# Patient Record
Sex: Male | Born: 1975 | Race: White | Hispanic: No | Smoking: Light tobacco smoker
Health system: Southern US, Community
[De-identification: ages and names within clinical notes are randomized; demographics above are authoritative.]

## PROBLEM LIST (undated history)

## (undated) DIAGNOSIS — F4310 Post-traumatic stress disorder, unspecified: Secondary | ICD-10-CM

## (undated) DIAGNOSIS — F32A Depression, unspecified: Secondary | ICD-10-CM

## (undated) DIAGNOSIS — G4700 Insomnia, unspecified: Secondary | ICD-10-CM

## (undated) DIAGNOSIS — K589 Irritable bowel syndrome without diarrhea: Secondary | ICD-10-CM

## (undated) DIAGNOSIS — F419 Anxiety disorder, unspecified: Secondary | ICD-10-CM

## (undated) DIAGNOSIS — K219 Gastro-esophageal reflux disease without esophagitis: Secondary | ICD-10-CM

## (undated) DIAGNOSIS — F329 Major depressive disorder, single episode, unspecified: Secondary | ICD-10-CM

## (undated) DIAGNOSIS — F319 Bipolar disorder, unspecified: Secondary | ICD-10-CM

## (undated) DIAGNOSIS — F909 Attention-deficit hyperactivity disorder, unspecified type: Secondary | ICD-10-CM

## (undated) HISTORY — PX: UPPER GASTROINTESTINAL ENDOSCOPY: SHX188

---

## 1998-06-03 ENCOUNTER — Emergency Department (HOSPITAL_COMMUNITY): Admission: EM | Admit: 1998-06-03 | Discharge: 1998-06-03 | Payer: Self-pay | Admitting: Emergency Medicine

## 2004-12-06 ENCOUNTER — Ambulatory Visit: Payer: Self-pay | Admitting: Internal Medicine

## 2004-12-07 ENCOUNTER — Ambulatory Visit: Payer: Self-pay | Admitting: Internal Medicine

## 2004-12-09 ENCOUNTER — Ambulatory Visit (HOSPITAL_COMMUNITY): Admission: RE | Admit: 2004-12-09 | Discharge: 2004-12-09 | Payer: Self-pay | Admitting: Internal Medicine

## 2004-12-20 ENCOUNTER — Ambulatory Visit: Payer: Self-pay | Admitting: *Deleted

## 2004-12-23 ENCOUNTER — Ambulatory Visit: Payer: Self-pay | Admitting: Internal Medicine

## 2005-01-31 ENCOUNTER — Ambulatory Visit: Payer: Self-pay | Admitting: Internal Medicine

## 2005-04-15 ENCOUNTER — Ambulatory Visit: Payer: Self-pay | Admitting: Internal Medicine

## 2005-08-02 ENCOUNTER — Ambulatory Visit: Payer: Self-pay | Admitting: Internal Medicine

## 2005-08-18 ENCOUNTER — Encounter (INDEPENDENT_AMBULATORY_CARE_PROVIDER_SITE_OTHER): Payer: Self-pay | Admitting: *Deleted

## 2005-08-18 ENCOUNTER — Ambulatory Visit (HOSPITAL_COMMUNITY): Admission: RE | Admit: 2005-08-18 | Discharge: 2005-08-18 | Payer: Self-pay | Admitting: Internal Medicine

## 2005-08-23 ENCOUNTER — Ambulatory Visit: Payer: Self-pay | Admitting: Internal Medicine

## 2005-12-09 ENCOUNTER — Ambulatory Visit: Payer: Self-pay | Admitting: Internal Medicine

## 2006-05-18 ENCOUNTER — Ambulatory Visit: Payer: Self-pay | Admitting: Internal Medicine

## 2006-06-05 ENCOUNTER — Emergency Department (HOSPITAL_COMMUNITY): Admission: EM | Admit: 2006-06-05 | Discharge: 2006-06-05 | Payer: Self-pay | Admitting: Family Medicine

## 2006-09-07 ENCOUNTER — Ambulatory Visit: Payer: Self-pay | Admitting: Internal Medicine

## 2007-01-11 ENCOUNTER — Ambulatory Visit: Payer: Self-pay | Admitting: Internal Medicine

## 2007-01-16 ENCOUNTER — Ambulatory Visit: Payer: Self-pay | Admitting: Internal Medicine

## 2007-02-23 ENCOUNTER — Ambulatory Visit: Payer: Self-pay | Admitting: Internal Medicine

## 2007-05-02 ENCOUNTER — Encounter (INDEPENDENT_AMBULATORY_CARE_PROVIDER_SITE_OTHER): Payer: Self-pay | Admitting: *Deleted

## 2007-11-06 ENCOUNTER — Emergency Department (HOSPITAL_COMMUNITY): Admission: EM | Admit: 2007-11-06 | Discharge: 2007-11-06 | Payer: Self-pay | Admitting: Emergency Medicine

## 2010-08-18 ENCOUNTER — Emergency Department (HOSPITAL_COMMUNITY)
Admission: EM | Admit: 2010-08-18 | Discharge: 2010-08-18 | Payer: Self-pay | Source: Home / Self Care | Admitting: Emergency Medicine

## 2010-08-18 LAB — CBC
HCT: 38.2 % — ABNORMAL LOW (ref 39.0–52.0)
Hemoglobin: 13.4 g/dL (ref 13.0–17.0)
MCH: 29.8 pg (ref 26.0–34.0)
MCHC: 35.1 g/dL (ref 30.0–36.0)
MCV: 84.9 fL (ref 78.0–100.0)
Platelets: 195 10*3/uL (ref 150–400)
RBC: 4.5 MIL/uL (ref 4.22–5.81)
RDW: 12.9 % (ref 11.5–15.5)
WBC: 9.4 10*3/uL (ref 4.0–10.5)

## 2010-08-18 LAB — RAPID URINE DRUG SCREEN, HOSP PERFORMED
Amphetamines: NOT DETECTED
Barbiturates: NOT DETECTED
Benzodiazepines: POSITIVE — AB
Cocaine: NOT DETECTED
Opiates: POSITIVE — AB
Tetrahydrocannabinol: NOT DETECTED

## 2010-08-18 LAB — DIFFERENTIAL
Basophils Absolute: 0.1 10*3/uL (ref 0.0–0.1)
Basophils Relative: 1 % (ref 0–1)
Eosinophils Absolute: 0.4 10*3/uL (ref 0.0–0.7)
Eosinophils Relative: 4 % (ref 0–5)
Lymphocytes Relative: 26 % (ref 12–46)
Lymphs Abs: 2.5 10*3/uL (ref 0.7–4.0)
Monocytes Absolute: 0.4 10*3/uL (ref 0.1–1.0)
Monocytes Relative: 4 % (ref 3–12)
Neutro Abs: 6.1 10*3/uL (ref 1.7–7.7)
Neutrophils Relative %: 65 % (ref 43–77)

## 2010-08-18 LAB — BASIC METABOLIC PANEL
BUN: 12 mg/dL (ref 6–23)
CO2: 27 mEq/L (ref 19–32)
Calcium: 9.4 mg/dL (ref 8.4–10.5)
Chloride: 104 mEq/L (ref 96–112)
Creatinine, Ser: 0.97 mg/dL (ref 0.4–1.5)
GFR calc Af Amer: >60 mL/min (ref >60)
GFR calc non Af Amer: >60 mL/min (ref >60)
Glucose, Bld: 90 mg/dL (ref 70–99)
Potassium: 3.3 mEq/L — ABNORMAL LOW (ref 3.5–5.1)
Sodium: 139 mEq/L (ref 135–145)

## 2010-08-18 LAB — URINALYSIS, ROUTINE W REFLEX MICROSCOPIC
Bilirubin Urine: NEGATIVE
Hemoglobin, Urine: NEGATIVE
Ketones, ur: NEGATIVE mg/dL
Nitrite: NEGATIVE
Protein, ur: NEGATIVE mg/dL
Specific Gravity, Urine: 1.019 (ref 1.005–1.030)
Urine Glucose, Fasting: NEGATIVE mg/dL
Urobilinogen, UA: 0.2 mg/dL (ref 0.0–1.0)
pH: 7 (ref 5.0–8.0)

## 2011-02-24 ENCOUNTER — Encounter: Payer: Self-pay | Admitting: *Deleted

## 2011-02-24 ENCOUNTER — Emergency Department (HOSPITAL_COMMUNITY)
Admission: EM | Admit: 2011-02-24 | Discharge: 2011-02-25 | Disposition: A | Discharge status: Other Acute Inpatient Hospital | Payer: Medicaid Other | Source: Home / Self Care | Attending: Emergency Medicine | Admitting: Emergency Medicine

## 2011-02-24 DIAGNOSIS — F419 Anxiety disorder, unspecified: Secondary | ICD-10-CM | POA: Insufficient documentation

## 2011-02-24 DIAGNOSIS — K589 Irritable bowel syndrome without diarrhea: Secondary | ICD-10-CM | POA: Insufficient documentation

## 2011-02-24 DIAGNOSIS — F329 Major depressive disorder, single episode, unspecified: Secondary | ICD-10-CM | POA: Insufficient documentation

## 2011-02-24 DIAGNOSIS — F3289 Other specified depressive episodes: Secondary | ICD-10-CM | POA: Insufficient documentation

## 2011-02-24 DIAGNOSIS — K219 Gastro-esophageal reflux disease without esophagitis: Secondary | ICD-10-CM | POA: Insufficient documentation

## 2011-02-24 HISTORY — DX: Anxiety disorder, unspecified: F41.9

## 2011-02-24 HISTORY — DX: Gastro-esophageal reflux disease without esophagitis: K21.9

## 2011-02-24 HISTORY — DX: Depression, unspecified: F32.A

## 2011-02-24 HISTORY — DX: Major depressive disorder, single episode, unspecified: F32.9

## 2011-02-24 HISTORY — DX: Irritable bowel syndrome without diarrhea: K58.9

## 2011-02-24 LAB — CBC
HCT: 37.9 % — ABNORMAL LOW (ref 39.0–52.0)
Hemoglobin: 13.7 g/dL (ref 13.0–17.0)
MCH: 29.5 pg (ref 26.0–34.0)
MCHC: 36.1 g/dL — ABNORMAL HIGH (ref 30.0–36.0)
MCV: 81.7 fL (ref 78.0–100.0)
Platelets: 203 10*3/uL (ref 150–400)
RBC: 4.64 MIL/uL (ref 4.22–5.81)
RDW: 13.2 % (ref 11.5–15.5)
WBC: 7.4 10*3/uL (ref 4.0–10.5)

## 2011-02-24 LAB — DIFFERENTIAL
Basophils Absolute: 0.1 10*3/uL (ref 0.0–0.1)
Basophils Relative: 1 % (ref 0–1)
Eosinophils Absolute: 0.4 10*3/uL (ref 0.0–0.7)
Eosinophils Relative: 5 % (ref 0–5)
Lymphocytes Relative: 31 % (ref 12–46)
Lymphs Abs: 2.3 10*3/uL (ref 0.7–4.0)
Monocytes Absolute: 0.4 10*3/uL (ref 0.1–1.0)
Monocytes Relative: 6 % (ref 3–12)
Neutro Abs: 4.3 10*3/uL (ref 1.7–7.7)
Neutrophils Relative %: 57 % (ref 43–77)

## 2011-02-24 LAB — BASIC METABOLIC PANEL
BUN: 9 mg/dL (ref 6–23)
CO2: 27 mEq/L (ref 19–32)
Calcium: 9.3 mg/dL (ref 8.4–10.5)
Chloride: 104 mEq/L (ref 96–112)
Creatinine, Ser: 0.7 mg/dL (ref 0.50–1.35)
GFR calc Af Amer: >60 mL/min (ref >60)
GFR calc non Af Amer: >60 mL/min (ref >60)
Glucose, Bld: 89 mg/dL (ref 70–99)
Potassium: 3.5 mEq/L (ref 3.5–5.1)
Sodium: 139 mEq/L (ref 135–145)

## 2011-02-24 LAB — RAPID URINE DRUG SCREEN, HOSP PERFORMED
Amphetamines: NOT DETECTED
Barbiturates: NOT DETECTED
Benzodiazepines: POSITIVE — AB
Cocaine: NOT DETECTED
Opiates: NOT DETECTED
Tetrahydrocannabinol: NOT DETECTED

## 2011-02-24 LAB — ETHANOL: Alcohol, Ethyl (B): <11 mg/dL (ref 0–11)

## 2011-02-24 MED ORDER — LORAZEPAM 1 MG PO TABS
2.0000 mg | ORAL_TABLET | Freq: Once | ORAL | Status: AC
  Administered 2011-02-24: 2 mg via ORAL
  Filled 2011-02-24: qty 2

## 2011-02-24 NOTE — ED Notes (Signed)
Pt sleeping. 
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
## 2011-02-24 NOTE — ED Notes (Signed)
Police sitting with patient 

## 2011-02-24 NOTE — ED Provider Notes (Signed)
History     Chief Complaint  Patient presents with  . Medical Clearance   The history is provided by the patient. No language interpreter was used.  the pt presented to [facility] today of his own volition.  He says he's had suicidal thoughts but would never act on them.  He feels very depressed and lies around the house almost all day every day.  He used to find joy in playing with his children, hunting and fishing but does none of those things now.  He recently lost his job.  He had a recent argument with his mother in law.  He wanted to see a psychiatrist but was sent to the ED for  Medical evaluation.  He gets his current meds through his PCP and doesn't feel that they are working as well as they used to.  Past Medical History  Diagnosis Date  . Anxiety   . Depression   . Acid reflux   . Irritable bowel syndrome     Past Surgical History  Procedure Date  . Upper gastrointestinal endoscopy     History reviewed. No pertinent family history.  History  Substance Use Topics  . Smoking status: Never Smoker   . Smokeless tobacco: Current User  . Alcohol Use: No      Review of Systems  Constitutional: Positive for activity change.  Psychiatric/Behavioral: Positive for suicidal ideas, dysphoric mood and agitation. The patient is nervous/anxious.   All other systems reviewed and are negative.    Physical Exam  BP 121/77  Pulse 83  Temp(Src) 97.7 F (36.5 C) (Oral)  Resp 24  Ht 5\' 8"  (1.727 m)  Wt 175 lb (79.379 kg)  BMI 26.61 kg/m2  SpO2 100%  Physical Exam  Nursing note and vitals reviewed. Constitutional: He is oriented to person, place, and time. Vital signs are normal. He appears well-developed and well-nourished.  HENT:  Head: Normocephalic and atraumatic.  Right Ear: External ear normal.  Left Ear: External ear normal.  Nose: Nose normal.  Mouth/Throat: No oropharyngeal exudate.  Eyes: Conjunctivae and EOM are normal. Pupils are equal, round, and reactive to  light. Right eye exhibits no discharge. Left eye exhibits no discharge. No scleral icterus.  Neck: Normal range of motion. Neck supple. No JVD present. No tracheal deviation present. No thyromegaly present.  Cardiovascular: Normal rate, regular rhythm, normal heart sounds, intact distal pulses and normal pulses.  Exam reveals no gallop and no friction rub.   No murmur heard. Pulmonary/Chest: Effort normal and breath sounds normal. No stridor. No respiratory distress. He has no wheezes. He has no rales. He exhibits no tenderness.  Abdominal: Soft. Normal appearance and bowel sounds are normal. He exhibits no distension and no mass. There is no tenderness. There is no rebound and no guarding.  Musculoskeletal: Normal range of motion. He exhibits no edema and no tenderness.  Lymphadenopathy:    He has no cervical adenopathy.  Neurological: He is alert and oriented to person, place, and time. He has normal reflexes. No cranial nerve deficit. Coordination normal. GCS eye subscore is 4. GCS verbal subscore is 5. GCS motor subscore is 6.  Reflex Scores:      Tricep reflexes are 2+ on the right side and 2+ on the left side.      Bicep reflexes are 2+ on the right side and 2+ on the left side.      Brachioradialis reflexes are 2+ on the right side and 2+ on the left side.  Patellar reflexes are 2+ on the right side and 2+ on the left side.      Achilles reflexes are 2+ on the right side and 2+ on the left side. Skin: Skin is warm and dry. No rash noted. He is not diaphoretic.  Psychiatric: He has a normal mood and affect. His speech is normal and behavior is normal. Judgment normal. Thought content is not paranoid and not delusional. Cognition and memory are normal. Cognition and memory are not impaired. Inappropriate judgment: flat affect and depressed mood. He expresses suicidal ideation. He expresses no homicidal ideation. He expresses no suicidal plans and no homicidal plans.    ED Course    Procedures  MDM Pt cooperative and willing to go to  [facility].  Samson Frederic states there is a bed available and she will send after she can give report.  i have discussed the pt with dr. Deretha Emory who has assumed care.  PATIENT SEEN BY ME. ACCEPTED BY [facility] DR READLING TO [facility] FOR DEPRESSION. SEEN BY [facility] HERE AT [facility].     Worthy Rancher, PA 02/25/11 0004  Shelda Jakes, MD 02/25/11 [phone]

## 2011-02-25 ENCOUNTER — Inpatient Hospital Stay (HOSPITAL_COMMUNITY)
Admission: AD | Admit: 2011-02-25 | Discharge: 2011-02-28 | DRG: 885 | Disposition: A | Discharge status: Home / Self Care | Payer: Medicaid Other | Attending: Psychiatry | Admitting: Psychiatry

## 2011-02-25 DIAGNOSIS — F1021 Alcohol dependence, in remission: Secondary | ICD-10-CM

## 2011-02-25 DIAGNOSIS — K219 Gastro-esophageal reflux disease without esophagitis: Secondary | ICD-10-CM

## 2011-02-25 DIAGNOSIS — Z6379 Other stressful life events affecting family and household: Secondary | ICD-10-CM

## 2011-02-25 DIAGNOSIS — F339 Major depressive disorder, recurrent, unspecified: Secondary | ICD-10-CM

## 2011-02-25 DIAGNOSIS — K589 Irritable bowel syndrome without diarrhea: Secondary | ICD-10-CM

## 2011-02-25 DIAGNOSIS — F4010 Social phobia, unspecified: Secondary | ICD-10-CM

## 2011-02-25 DIAGNOSIS — F17200 Nicotine dependence, unspecified, uncomplicated: Secondary | ICD-10-CM

## 2011-02-25 DIAGNOSIS — F6381 Intermittent explosive disorder: Secondary | ICD-10-CM

## 2011-02-25 DIAGNOSIS — R45851 Suicidal ideations: Secondary | ICD-10-CM

## 2011-02-25 DIAGNOSIS — M25569 Pain in unspecified knee: Secondary | ICD-10-CM

## 2011-02-25 DIAGNOSIS — F332 Major depressive disorder, recurrent severe without psychotic features: Principal | ICD-10-CM

## 2011-02-25 DIAGNOSIS — Z818 Family history of other mental and behavioral disorders: Secondary | ICD-10-CM
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
## 2011-03-02 NOTE — Discharge Summary (Signed)
  NAMESIRIS, HOOS                 ACCOUNT NO.:  [id number]  MEDICAL RECORD NO.:  [id number]  LOCATION:  0504                          FACILITY:  [facility]  PHYSICIAN:  Franchot Gallo, MD     DATE OF BIRTH:  [birth year]  DATE OF ADMISSION:  02/25/2011 DATE OF DISCHARGE:  02/28/2011                              DISCHARGE SUMMARY   The patient was admitted for complaints of severe depressive symptoms with suicidal thoughts.  His sleep was decreased, his appetite was decreased and he was rating his depression an 8 on a scale of 1-10.  FINAL IMPRESSION:  Major depressive disorder, recurrent, severe, social phobia, intermittent explosive disorder, alcohol dependence in remission.  PERTINENT LABS:  His CMET was within normal limits.  His CBC was within normal limits.  Alcohol level was less than 11.  The patient was denying any suicidal thoughts.  His stress was being out of work and unable to find employment.  His appetite was improving.  He was having moderate depressive symptoms rating it a 4 on a scale of 1- 10.  Denied any suicidal or homicidal thoughts.  He was endorsing anxiety, rating it a 7-8 on a scale of 1-10, having no medication side effects.  We discontinued his trazodone and added Vistaril for sleep. We also had tramadol available for complaints of knee pain.  He was beginning to feel better, reporting mild depressive symptoms, having no medication side effects, on day of discharge felt he was ready to go, his sleep was good with Ambien, he was having a good appetite.  His depressive symptoms had resolved rating it a 0 on a scale of 1-10.  He adamantly denied any suicidal or homicidal thoughts or auditory or visualizations, reporting his anxiety a 3 on a scale of 1-10.    Discharge medications:  Cymbalta 60 mg one daily, hydroxyzine 50 mg taking 2 at bedtime, Ambien for sleep, Klonopin 1 mg t.i.d. for anxiety attacks, Nexium 40 mg for GERD symptoms, tramadol 50 mg taking 2  t.i.d. for knee pain.  The patient was to stop taking his Zoloft.  His follow- up appointment was with [facility] on Wednesday July 18 at 8:00 a.m., phone number [phone].     Landry Corporal, N.P.   ______________________________ Franchot Gallo, MD    JO/MEDQ  D:  03/01/2011  T:  03/02/2011  Job:  [id number]  Electronically Signed by Limmie PatriciaP. on 03/02/2011 09:47:45 AM Electronically Signed by Franchot Gallo MD on 03/02/2011 12:01:04 PM

## 2011-04-05 ENCOUNTER — Emergency Department (HOSPITAL_COMMUNITY)
Admission: EM | Admit: 2011-04-05 | Discharge: 2011-04-06 | Disposition: A | Discharge status: Other Acute Inpatient Hospital | Payer: Medicaid Other | Source: Home / Self Care | Attending: Emergency Medicine | Admitting: Emergency Medicine

## 2011-04-05 DIAGNOSIS — Z79899 Other long term (current) drug therapy: Secondary | ICD-10-CM | POA: Insufficient documentation

## 2011-04-05 DIAGNOSIS — F329 Major depressive disorder, single episode, unspecified: Secondary | ICD-10-CM | POA: Insufficient documentation

## 2011-04-05 DIAGNOSIS — F3289 Other specified depressive episodes: Secondary | ICD-10-CM | POA: Insufficient documentation

## 2011-04-05 LAB — COMPREHENSIVE METABOLIC PANEL
ALT: 20 U/L (ref 0–53)
AST: 16 U/L (ref 0–37)
Albumin: 4.6 g/dL (ref 3.5–5.2)
Alkaline Phosphatase: 60 U/L (ref 39–117)
BUN: 15 mg/dL (ref 6–23)
CO2: 31 mEq/L (ref 19–32)
Calcium: 9.9 mg/dL (ref 8.4–10.5)
Chloride: 97 mEq/L (ref 96–112)
Creatinine, Ser: 0.92 mg/dL (ref 0.50–1.35)
GFR calc Af Amer: >60 mL/min (ref >60)
GFR calc non Af Amer: >60 mL/min (ref >60)
Glucose, Bld: 85 mg/dL (ref 70–99)
Potassium: 3.4 mEq/L — ABNORMAL LOW (ref 3.5–5.1)
Sodium: 137 mEq/L (ref 135–145)
Total Bilirubin: 1 mg/dL (ref 0.3–1.2)
Total Protein: 8.5 g/dL — ABNORMAL HIGH (ref 6.0–8.3)

## 2011-04-05 LAB — DIFFERENTIAL
Basophils Absolute: 0.1 10*3/uL (ref 0.0–0.1)
Basophils Relative: 1 % (ref 0–1)
Eosinophils Absolute: 0.7 10*3/uL (ref 0.0–0.7)
Eosinophils Relative: 6 % — ABNORMAL HIGH (ref 0–5)
Lymphocytes Relative: 28 % (ref 12–46)
Lymphs Abs: 3.2 10*3/uL (ref 0.7–4.0)
Monocytes Absolute: 0.7 10*3/uL (ref 0.1–1.0)
Monocytes Relative: 6 % (ref 3–12)
Neutro Abs: 6.7 10*3/uL (ref 1.7–7.7)
Neutrophils Relative %: 59 % (ref 43–77)

## 2011-04-05 LAB — RAPID URINE DRUG SCREEN, HOSP PERFORMED
Amphetamines: NOT DETECTED
Barbiturates: NOT DETECTED
Benzodiazepines: POSITIVE — AB
Cocaine: NOT DETECTED
Opiates: NOT DETECTED
Tetrahydrocannabinol: NOT DETECTED

## 2011-04-05 LAB — CBC
HCT: 41.4 % (ref 39.0–52.0)
Hemoglobin: 14.8 g/dL (ref 13.0–17.0)
MCH: 29.5 pg (ref 26.0–34.0)
MCHC: 35.7 g/dL (ref 30.0–36.0)
MCV: 82.5 fL (ref 78.0–100.0)
Platelets: 259 10*3/uL (ref 150–400)
RBC: 5.02 MIL/uL (ref 4.22–5.81)
RDW: 13.9 % (ref 11.5–15.5)
WBC: 11.3 10*3/uL — ABNORMAL HIGH (ref 4.0–10.5)

## 2011-04-05 LAB — ETHANOL: Alcohol, Ethyl (B): <11 mg/dL (ref 0–11)

## 2011-04-06 ENCOUNTER — Inpatient Hospital Stay (HOSPITAL_COMMUNITY)
Admission: AD | Admit: 2011-04-06 | Discharge: 2011-04-11 | DRG: 885 | Disposition: A | Discharge status: Home / Self Care | Payer: Medicaid Other | Source: Ambulatory Visit | Attending: Psychiatry | Admitting: Psychiatry

## 2011-04-06 DIAGNOSIS — F6381 Intermittent explosive disorder: Secondary | ICD-10-CM

## 2011-04-06 DIAGNOSIS — R45851 Suicidal ideations: Secondary | ICD-10-CM

## 2011-04-06 DIAGNOSIS — M76899 Other specified enthesopathies of unspecified lower limb, excluding foot: Secondary | ICD-10-CM

## 2011-04-06 DIAGNOSIS — F4010 Social phobia, unspecified: Secondary | ICD-10-CM

## 2011-04-06 DIAGNOSIS — T1490XA Injury, unspecified, initial encounter: Secondary | ICD-10-CM

## 2011-04-06 DIAGNOSIS — G4700 Insomnia, unspecified: Secondary | ICD-10-CM

## 2011-04-06 DIAGNOSIS — Z6379 Other stressful life events affecting family and household: Secondary | ICD-10-CM

## 2011-04-06 DIAGNOSIS — K589 Irritable bowel syndrome without diarrhea: Secondary | ICD-10-CM

## 2011-04-06 DIAGNOSIS — F1021 Alcohol dependence, in remission: Secondary | ICD-10-CM

## 2011-04-06 DIAGNOSIS — K219 Gastro-esophageal reflux disease without esophagitis: Secondary | ICD-10-CM

## 2011-04-06 DIAGNOSIS — F17200 Nicotine dependence, unspecified, uncomplicated: Secondary | ICD-10-CM

## 2011-04-06 DIAGNOSIS — F339 Major depressive disorder, recurrent, unspecified: Secondary | ICD-10-CM

## 2011-04-06 DIAGNOSIS — M79609 Pain in unspecified limb: Secondary | ICD-10-CM

## 2011-04-06 DIAGNOSIS — Z79899 Other long term (current) drug therapy: Secondary | ICD-10-CM

## 2011-04-06 DIAGNOSIS — F332 Major depressive disorder, recurrent severe without psychotic features: Principal | ICD-10-CM

## 2011-04-06 DIAGNOSIS — Z818 Family history of other mental and behavioral disorders: Secondary | ICD-10-CM

## 2011-04-06 DIAGNOSIS — Z560 Unemployment, unspecified: Secondary | ICD-10-CM
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
## 2011-04-08 ENCOUNTER — Ambulatory Visit (HOSPITAL_COMMUNITY)
Admit: 2011-04-08 | Discharge: 2011-04-08 | Disposition: A | Discharge status: Home / Self Care | Payer: Medicaid Other | Attending: Psychiatry | Admitting: Psychiatry

## 2011-04-08 DIAGNOSIS — M79609 Pain in unspecified limb: Secondary | ICD-10-CM | POA: Insufficient documentation
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
## 2011-05-09 LAB — LIPASE, BLOOD: Lipase: 15

## 2011-05-09 LAB — URINALYSIS, ROUTINE W REFLEX MICROSCOPIC
Bilirubin Urine: NEGATIVE
Glucose, UA: NEGATIVE
Hgb urine dipstick: NEGATIVE
Ketones, ur: NEGATIVE
Nitrite: NEGATIVE
Protein, ur: NEGATIVE
Specific Gravity, Urine: 1.015
Urobilinogen, UA: 1
pH: 8

## 2011-05-09 LAB — COMPREHENSIVE METABOLIC PANEL
ALT: 12
AST: 17
Alkaline Phosphatase: 52
CO2: 27
Calcium: 9.5
GFR calc Af Amer: >60
GFR calc non Af Amer: >60
Glucose, Bld: 103 — ABNORMAL HIGH
Potassium: 3.8
Sodium: 139

## 2011-05-09 LAB — CBC
HCT: 36 — ABNORMAL LOW
Hemoglobin: 12.6 — ABNORMAL LOW
MCHC: 35
MCV: 86.6
Platelets: 229
RBC: 4.16 — ABNORMAL LOW
RDW: 13.2
WBC: 14 — ABNORMAL HIGH

## 2011-05-09 LAB — DIFFERENTIAL
Basophils Relative: 0
Eosinophils Absolute: 0.1
Eosinophils Relative: 1
Lymphs Abs: 1.4
Monocytes Relative: 4

## 2012-03-30 ENCOUNTER — Emergency Department (HOSPITAL_COMMUNITY): Payer: Medicaid Other

## 2012-03-30 ENCOUNTER — Emergency Department (HOSPITAL_COMMUNITY)
Admission: EM | Admit: 2012-03-30 | Discharge: 2012-03-30 | Disposition: A | Discharge status: Home / Self Care | Payer: Medicaid Other | Attending: Emergency Medicine | Admitting: Emergency Medicine

## 2012-03-30 ENCOUNTER — Encounter (HOSPITAL_COMMUNITY): Payer: Self-pay | Admitting: Emergency Medicine

## 2012-03-30 DIAGNOSIS — T07XXXA Unspecified multiple injuries, initial encounter: Secondary | ICD-10-CM

## 2012-03-30 DIAGNOSIS — W11XXXA Fall on and from ladder, initial encounter: Secondary | ICD-10-CM | POA: Insufficient documentation

## 2012-03-30 DIAGNOSIS — K589 Irritable bowel syndrome without diarrhea: Secondary | ICD-10-CM | POA: Insufficient documentation

## 2012-03-30 DIAGNOSIS — F341 Dysthymic disorder: Secondary | ICD-10-CM | POA: Insufficient documentation

## 2012-03-30 DIAGNOSIS — S39012A Strain of muscle, fascia and tendon of lower back, initial encounter: Secondary | ICD-10-CM

## 2012-03-30 DIAGNOSIS — K219 Gastro-esophageal reflux disease without esophagitis: Secondary | ICD-10-CM | POA: Insufficient documentation

## 2012-03-30 DIAGNOSIS — S335XXA Sprain of ligaments of lumbar spine, initial encounter: Secondary | ICD-10-CM | POA: Insufficient documentation

## 2012-03-30 DIAGNOSIS — Z79899 Other long term (current) drug therapy: Secondary | ICD-10-CM | POA: Insufficient documentation

## 2012-03-30 DIAGNOSIS — S139XXA Sprain of joints and ligaments of unspecified parts of neck, initial encounter: Secondary | ICD-10-CM | POA: Insufficient documentation

## 2012-03-30 DIAGNOSIS — S161XXA Strain of muscle, fascia and tendon at neck level, initial encounter: Secondary | ICD-10-CM

## 2012-03-30 HISTORY — DX: Post-traumatic stress disorder, unspecified: F43.10

## 2012-03-30 MED ORDER — METHOCARBAMOL 500 MG PO TABS
1000.0000 mg | ORAL_TABLET | Freq: Once | ORAL | Status: AC
  Administered 2012-03-30: 1000 mg via ORAL
  Filled 2012-03-30: qty 2

## 2012-03-30 MED ORDER — HYDROCODONE-ACETAMINOPHEN 7.5-325 MG PO TABS: ORAL_TABLET | ORAL | Status: DC

## 2012-03-30 MED ORDER — MORPHINE SULFATE 4 MG/ML IJ SOLN
8.0000 mg | Freq: Once | INTRAMUSCULAR | Status: AC
  Administered 2012-03-30: 4 mg via INTRAMUSCULAR
  Filled 2012-03-30: qty 1

## 2012-03-30 MED ORDER — ONDANSETRON HCL 4 MG PO TABS
4.0000 mg | ORAL_TABLET | Freq: Once | ORAL | Status: AC
  Administered 2012-03-30: 4 mg via ORAL
  Filled 2012-03-30: qty 1

## 2012-03-30 MED ORDER — METHOCARBAMOL 500 MG PO TABS: ORAL_TABLET | ORAL | Status: DC

## 2012-03-30 MED ORDER — MORPHINE SULFATE 4 MG/ML IJ SOLN
INTRAMUSCULAR | Status: AC
  Administered 2012-03-30: 4 mg
  Filled 2012-03-30: qty 1

## 2012-03-30 NOTE — ED Notes (Addendum)
Patient with no complaints at this time. Respirations even and unlabored. Skin warm/dry. Discharge instructions reviewed with patient at this time. Patient given opportunity to voice concerns/ask questions. Patient discharged at this time and left Emergency Department with steady gait.   

## 2012-03-30 NOTE — ED Provider Notes (Signed)
History     CSN: [id number]  Arrival date & time 03/30/12  1000   First MD Initiated Contact with Patient 03/30/12 1020      Chief Complaint  Patient presents with  . Fall    (Consider location/radiation/quality/duration/timing/severity/associated sxs/prior treatment) HPI Comments: the patient states that on Wednesday, August 14 he was approximately 10 feet on a ladder cleaning out a gutter. The patient states that he lost his footing and fell landing primarily on his back and buttocks. Since that time the patient states he has had problems with back pain, headaches, neck pain, left arm pain, and right knee pain. the patient has tried trauma gall at home as well as ibuprofen but states this is not helping. His significant other has been doing back rubs but he still has complaint of pain. There's been no trouble with cough or difficulty breathing, there's been no loss of consciousness, and there's been no hematuria. His been no fever reported.   Patient is a 36 y.o. male presenting with fall. The history is provided by the patient.  Fall Associated symptoms include abdominal pain. Pertinent negatives include no hematuria.    Past Medical History  Diagnosis Date  . Anxiety   . Depression   . Acid reflux   . Irritable bowel syndrome   . PTSD (post-traumatic stress disorder)     Past Surgical History  Procedure Date  . Upper gastrointestinal endoscopy     History reviewed. No pertinent family history.  History  Substance Use Topics  . Smoking status: Never Smoker   . Smokeless tobacco: Current User    Types: Snuff  . Alcohol Use: Yes      Review of Systems  Constitutional: Negative for activity change.       All ROS Neg except as noted in HPI  HENT: Negative for nosebleeds and neck pain.   Eyes: Negative for photophobia and discharge.  Respiratory: Negative for cough, shortness of breath and wheezing.   Cardiovascular: Negative for chest pain and palpitations.    Gastrointestinal: Positive for abdominal pain and diarrhea. Negative for blood in stool.  Genitourinary: Negative for dysuria, frequency and hematuria.  Musculoskeletal: Negative for back pain and arthralgias.  Skin: Negative.   Neurological: Negative for dizziness, seizures and speech difficulty.  Psychiatric/Behavioral: Negative for hallucinations and confusion. The patient is nervous/anxious.        Depression    Allergies  Divalproex sodium  Home Medications   Current Outpatient Rx  Name Route Sig Dispense Refill  . CLONAZEPAM 1 MG PO TABS Oral Take 1 mg by mouth 2 (two) times daily as needed.      Marland Kitchen ESOMEPRAZOLE MAGNESIUM 10 MG PO PACK Oral Take 10 mg by mouth daily before breakfast.      . ZOLOFT PO Oral Take by mouth.      . TESTOSTERONE ENANTHATE 200 MG/ML IM OIL Intramuscular Inject into the muscle every 28 (twenty-eight) days.      . TRAMADOL HCL ER 100 MG PO TB24 Oral Take 100 mg by mouth daily.      Marland Kitchen ZOLPIDEM TARTRATE ER 12.5 MG PO TBCR Oral Take 12.5 mg by mouth at bedtime as needed.        BP 118/91  Pulse 105  Temp 98 F (36.7 C) (Oral)  Resp 20  Ht 5\' 7"  (1.702 m)  Wt 180 lb (81.647 kg)  BMI 28.19 kg/m2  SpO2 98%  Physical Exam  Nursing note and vitals reviewed. Constitutional: He is  oriented to person, place, and time. He appears well-developed and well-nourished.  Non-toxic appearance.  HENT:  Head: Normocephalic.  Right Ear: Tympanic membrane and external ear normal.  Left Ear: Tympanic membrane and external ear normal.  Eyes: EOM and lids are normal. Pupils are equal, round, and reactive to light.  Neck: Normal range of motion. Neck supple. Carotid bruit is not present.       Pain to palpation at the base of the neck. No palpable step off. No carotid bruit.  Cardiovascular: Regular rhythm, normal heart sounds, intact distal pulses and normal pulses.  Tachycardia present.   Pulmonary/Chest: Breath sounds normal. No respiratory distress.        Symmetrical rise and fall of the chest. No crepitus. No palpable deformity of the right or left rib areas.  Abdominal: Soft. Bowel sounds are normal. There is no tenderness. There is no guarding.  Musculoskeletal: Normal range of motion.       Pain to palpation of the upper back the twin the shoulder blades to palpation. No bruising or deformity noted. Pain to palpation of the lumbar area with right paraspinal tenderness to palpation in change of position. Pain of the lower back with right straight leg raise.  Lymphadenopathy:       Head (right side): No submandibular adenopathy present.       Head (left side): No submandibular adenopathy present.    He has no cervical adenopathy.  Neurological: He is alert and oriented to person, place, and time. He has normal strength. No cranial nerve deficit or sensory deficit. He exhibits normal muscle tone. Coordination normal.       Grip symmetrical. Speech clear. No motor or sensory deficits.  Skin: Skin is warm and dry.  Psychiatric: His speech is normal. His mood appears anxious.    ED Course  Procedures (including critical care time)   Labs Reviewed  URINALYSIS, ROUTINE W REFLEX MICROSCOPIC   No results found.   No diagnosis found.    MDM  I have reviewed nursing notes, vital signs, and all appropriate lab and imaging results for this patient. The x-ray of the lumbar spine revealed some mild spondylosis, but no fracture or dislocation. The height and disc space were within normal limits. The cervical spine was well within normal limits on x-ray. The pelvis showed no fracture or dislocation. The patient was unable to give a urine specimen during his time here in the emergency department. Pain was moderately improved with IM morphine. I ambulated the patient in the room and  [facility] without problem.      Kathie Dike, [place] 03/30/12 1306

## 2012-03-30 NOTE — ED Notes (Signed)
Pt states he fell 22ft to the ground while cleaning out the gutter. Pt c/o back pain, headaches, left arm pain, and rt knee pain.

## 2012-03-30 NOTE — ED Provider Notes (Signed)
Medical screening examination/treatment/procedure(s) were performed by non-physician practitioner and as supervising physician I was immediately available for consultation/collaboration.   Laray Anger, DO 03/30/12 1851

## 2013-03-25 ENCOUNTER — Encounter (HOSPITAL_COMMUNITY): Payer: Self-pay | Admitting: Emergency Medicine

## 2013-03-25 ENCOUNTER — Emergency Department (HOSPITAL_COMMUNITY)
Admission: EM | Admit: 2013-03-25 | Discharge: 2013-03-26 | Disposition: A | Discharge status: Other Acute Inpatient Hospital | Payer: Medicaid Other | Attending: Dermatology | Admitting: Dermatology

## 2013-03-25 DIAGNOSIS — R450 Nervousness: Secondary | ICD-10-CM | POA: Insufficient documentation

## 2013-03-25 DIAGNOSIS — Z79899 Other long term (current) drug therapy: Secondary | ICD-10-CM | POA: Insufficient documentation

## 2013-03-25 DIAGNOSIS — Z8719 Personal history of other diseases of the digestive system: Secondary | ICD-10-CM | POA: Insufficient documentation

## 2013-03-25 DIAGNOSIS — F329 Major depressive disorder, single episode, unspecified: Secondary | ICD-10-CM | POA: Diagnosis present

## 2013-03-25 DIAGNOSIS — F419 Anxiety disorder, unspecified: Secondary | ICD-10-CM

## 2013-03-25 DIAGNOSIS — F4310 Post-traumatic stress disorder, unspecified: Secondary | ICD-10-CM | POA: Insufficient documentation

## 2013-03-25 DIAGNOSIS — F909 Attention-deficit hyperactivity disorder, unspecified type: Secondary | ICD-10-CM | POA: Diagnosis present

## 2013-03-25 DIAGNOSIS — R45851 Suicidal ideations: Secondary | ICD-10-CM | POA: Insufficient documentation

## 2013-03-25 DIAGNOSIS — F39 Unspecified mood [affective] disorder: Secondary | ICD-10-CM

## 2013-03-25 DIAGNOSIS — F411 Generalized anxiety disorder: Secondary | ICD-10-CM | POA: Insufficient documentation

## 2013-03-25 LAB — CBC
HCT: 36.6 % — ABNORMAL LOW (ref 39.0–52.0)
Hemoglobin: 13.5 g/dL (ref 13.0–17.0)
MCH: 31.2 pg (ref 26.0–34.0)
MCHC: 36.9 g/dL — ABNORMAL HIGH (ref 30.0–36.0)
RDW: 13.2 % (ref 11.5–15.5)

## 2013-03-25 LAB — COMPREHENSIVE METABOLIC PANEL
Albumin: 4.3 g/dL (ref 3.5–5.2)
BUN: 13 mg/dL (ref 6–23)
Calcium: 10.2 mg/dL (ref 8.4–10.5)
GFR calc Af Amer: >90 mL/min (ref >90)
Glucose, Bld: 103 mg/dL — ABNORMAL HIGH (ref 70–99)
Total Protein: 7.2 g/dL (ref 6.0–8.3)

## 2013-03-25 LAB — RAPID URINE DRUG SCREEN, HOSP PERFORMED
Cocaine: NOT DETECTED
Opiates: NOT DETECTED

## 2013-03-25 LAB — SALICYLATE LEVEL: Salicylate Lvl: <2 mg/dL — ABNORMAL LOW (ref 2.8–20.0)

## 2013-03-25 LAB — ETHANOL: Alcohol, Ethyl (B): <11 mg/dL (ref 0–11)

## 2013-03-25 MED ORDER — IBUPROFEN 200 MG PO TABS
600.0000 mg | ORAL_TABLET | Freq: Three times a day (TID) | ORAL | Status: DC | PRN
  Administered 2013-03-26: 600 mg via ORAL
  Filled 2013-03-25: qty 3

## 2013-03-25 MED ORDER — ZOLPIDEM TARTRATE 5 MG PO TABS
5.0000 mg | ORAL_TABLET | Freq: Every evening | ORAL | Status: DC | PRN
  Administered 2013-03-25: 5 mg via ORAL
  Filled 2013-03-25: qty 1

## 2013-03-25 MED ORDER — QUETIAPINE FUMARATE 50 MG PO TABS
50.0000 mg | ORAL_TABLET | Freq: Every day | ORAL | Status: DC
  Administered 2013-03-25: 50 mg via ORAL
  Filled 2013-03-25: qty 1

## 2013-03-25 MED ORDER — ACETAMINOPHEN 325 MG PO TABS: 650.0000 mg | ORAL_TABLET | ORAL | Status: DC | PRN

## 2013-03-25 MED ORDER — LORAZEPAM 1 MG PO TABS
1.0000 mg | ORAL_TABLET | Freq: Three times a day (TID) | ORAL | Status: DC | PRN
  Administered 2013-03-26: 1 mg via ORAL
  Filled 2013-03-25: qty 1

## 2013-03-25 NOTE — Consult Note (Signed)
[facility] Psychiatry Consult   Reason for Consult:  Evaluation for IP psychiatric mgmt Referring Physician:  [facility] EDP  Tyler Lambert is an 37 y.o. male.  Assessment: AXIS I:  Mood Disorder NOS and Post Traumatic Stress Disorder AXIS II:  No diagnosis AXIS III:   Past Medical History  Diagnosis Date  . Anxiety   . Depression   . Acid reflux   . Irritable bowel syndrome   . PTSD (post-traumatic stress disorder)    AXIS IV:  other psychosocial or environmental problems AXIS V:  11-20 some danger of hurting self or others possible OR occasionally fails to maintain minimal personal hygiene OR gross impairment in communication  Plan:  Recommend psychiatric Inpatient admission when medically cleared.  Subjective:   Tyler Lambert is a 37 y.o. male patient presenting voluntarily to [facility] ED due to worsening depressive sx over the past few months. Patient rates his depressive sx an 8/10 and endorses racing thoughts, decreased concentration, sadness, crying spells, mood swings, helplessness and hopelessness. The patient also endorses SI with plan to shoot himself in the woods. The patient does have access to firearms. The patient  Denies any HI or AVH. The patient has a hx of MDD and ADHD. Stressors include social economic stressor, and being taking of his long term adderall medication for treatment of his  ADHD.  HPI:  HPI Elements:     Past Psychiatric History: Past Medical History  Diagnosis Date  . Anxiety   . Depression   . Acid reflux   . Irritable bowel syndrome   . PTSD (post-traumatic stress disorder)     reports that he has never smoked. His smokeless tobacco use includes Snuff. He reports that  drinks alcohol. He reports that he does not use illicit drugs. History reviewed. No pertinent family history.         Allergies:   Allergies  Allergen Reactions  . Divalproex Sodium Other (See Comments)    Patient unsure of reaction    Past Psychiatric History: Diagnosis:  MDD and PTSD   Hospitalizations:  [facility] in the past  Outpatient Care: N/A  Substance Abuse Care:  n/a  Self-Mutilation:  no  Suicidal Attempts:  no  Violent Behaviors:  no   Objective: Blood pressure 113/74, pulse 100, temperature 98.4 F (36.9 C), temperature source Oral, resp. rate 17, SpO2 98.00%.There is no weight on file to calculate BMI. Results for orders placed during the hospital encounter of 03/25/13 (from the past 72 hour(s))  URINE RAPID DRUG SCREEN (HOSP PERFORMED)     Status: None   Collection Time    03/25/13  8:08 PM      Result Value Range   Opiates NONE DETECTED  NONE DETECTED   Cocaine NONE DETECTED  NONE DETECTED   Benzodiazepines NONE DETECTED  NONE DETECTED   Amphetamines NONE DETECTED  NONE DETECTED   Tetrahydrocannabinol NONE DETECTED  NONE DETECTED   Barbiturates NONE DETECTED  NONE DETECTED   Comment:            DRUG SCREEN FOR MEDICAL PURPOSES     ONLY.  IF CONFIRMATION IS NEEDED     FOR ANY PURPOSE, NOTIFY LAB     WITHIN 5 DAYS.                LOWEST DETECTABLE LIMITS     FOR URINE DRUG SCREEN     Drug Class       Cutoff (ng/mL)     Amphetamine  1000     Barbiturate      200     Benzodiazepine   200     Tricyclics       300     Opiates          300     Cocaine          300     THC              50  ACETAMINOPHEN LEVEL     Status: None   Collection Time    03/25/13  8:13 PM      Result Value Range   Acetaminophen (Tylenol), Serum 18.3  10 - 30 ug/mL   Comment:            THERAPEUTIC CONCENTRATIONS VARY     SIGNIFICANTLY. A RANGE OF 10-30     ug/mL MAY BE AN EFFECTIVE     CONCENTRATION FOR MANY PATIENTS.     HOWEVER, SOME ARE BEST TREATED     AT CONCENTRATIONS OUTSIDE THIS     RANGE.     ACETAMINOPHEN CONCENTRATIONS     >150 ug/mL AT 4 HOURS AFTER     INGESTION AND >50 ug/mL AT 12     HOURS AFTER INGESTION ARE     OFTEN ASSOCIATED WITH TOXIC     REACTIONS.  CBC     Status: Abnormal   Collection Time    03/25/13  8:13 PM      Result Value Range    WBC 9.3  4.0 - 10.5 K/uL   RBC 4.33  4.22 - 5.81 MIL/uL   Hemoglobin 13.5  13.0 - 17.0 g/dL   HCT 16.1 (*) 09.6 - 04.5 %   MCV 84.5  78.0 - 100.0 fL   MCH 31.2  26.0 - 34.0 pg   MCHC 36.9 (*) 30.0 - 36.0 g/dL   RDW 40.9  81.1 - 91.4 %   Platelets 258  150 - 400 K/uL  COMPREHENSIVE METABOLIC PANEL     Status: Abnormal   Collection Time    03/25/13  8:13 PM      Result Value Range   Sodium 136  135 - 145 mEq/L   Potassium 3.9  3.5 - 5.1 mEq/L   Chloride 100  96 - 112 mEq/L   CO2 24  19 - 32 mEq/L   Glucose, Bld 103 (*) 70 - 99 mg/dL   BUN 13  6 - 23 mg/dL   Creatinine, Ser 7.82  0.50 - 1.35 mg/dL   Calcium 95.6  8.4 - 21.3 mg/dL   Total Protein 7.2  6.0 - 8.3 g/dL   Albumin 4.3  3.5 - 5.2 g/dL   AST 16  0 - 37 U/L   ALT 17  0 - 53 U/L   Alkaline Phosphatase 47  39 - 117 U/L   Total Bilirubin 0.6  0.3 - 1.2 mg/dL   GFR calc non Af Amer >90  >90 mL/min   GFR calc Af Amer >90  >90 mL/min   Comment:            The eGFR has been calculated     using the CKD EPI equation.     This calculation has not been     validated in all clinical     situations.     eGFR's persistently     <90 mL/min signify     possible Chronic Kidney Disease.  Tyler Lambert     Status:  None   Collection Time    03/25/13  8:13 PM      Result Value Range   Alcohol, Ethyl (B) <11  0 - 11 mg/dL   Comment:            LOWEST DETECTABLE LIMIT FOR     SERUM ALCOHOL IS 11 mg/dL     FOR MEDICAL PURPOSES ONLY  SALICYLATE LEVEL     Status: Abnormal   Collection Time    03/25/13  8:13 PM      Result Value Range   Salicylate Lvl <2.0 (*) 2.8 - 20.0 mg/dL   Labs are reviewed and are pertinent for ( no critical lab values)  Current Facility-Administered Medications  Medication Dose Route Frequency Provider Last Rate Last Dose  . acetaminophen (TYLENOL) tablet 650 mg  650 mg Oral Q4H PRN Suzi Roots, MD      . ibuprofen (ADVIL,MOTRIN) tablet 600 mg  600 mg Oral Q8H PRN Suzi Roots, MD      . LORazepam  (ATIVAN) tablet 1 mg  1 mg Oral Q8H PRN Suzi Roots, MD      . QUEtiapine (SEROQUEL) tablet 50 mg  50 mg Oral QHS Kerry Hough, PA-C      . zolpidem (AMBIEN) tablet 5 mg  5 mg Oral QHS PRN Suzi Roots, MD       Current Outpatient Prescriptions  Medication Sig Dispense Refill  . clonazePAM (KLONOPIN) 1 MG tablet Take 1 mg by mouth 2 (two) times daily as needed for anxiety.       . diphenhydramine-acetaminophen (TYLENOL PM) 25-500 MG TABS Take 1 tablet by mouth at bedtime as needed (pain and sleep).       Marland Kitchen PARoxetine (PAXIL) 40 MG tablet Take 60 mg by mouth at bedtime. 40 mg and half of a 40 mg tablet      . traMADol (ULTRAM) 50 MG tablet Take 50 mg by mouth every 6 (six) hours as needed for pain.       Marland Kitchen zolpidem (AMBIEN) 10 MG tablet Take 10 mg by mouth at bedtime.        Psychiatric Specialty Exam:     Blood pressure 113/74, pulse 100, temperature 98.4 F (36.9 C), temperature source Oral, resp. rate 17, SpO2 98.00%.There is no weight on file to calculate BMI.  General Appearance: Casual  Eye Contact::  Good  Speech:  Normal Rate  Volume:  Decreased  Mood:  Depressed  Affect:  Congruent  Thought Process:  Circumstantial  Orientation:  Full (Time, Place, and Person)  Thought Content:  Negative  Suicidal Thoughts:  Yes.  with intent/plan  Homicidal Thoughts:  No  Memory:  Immediate;   Good  Judgement:  Fair  Insight:  Fair  Psychomotor Activity:  Negative  Concentration:  Fair  Recall:  Fair  Akathisia:  Negative  Handed:  Right  AIMS (if indicated):     Assets:  Desire for Improvement  Sleep:      Treatment Plan Summary: 1) Patient meets criteria for IP crises mgmt, safety and stabilization of MDD/ADHD with SI/Plan 2) Social work to aid in OP support services and psychiatric mgmt 3) Mgmt of co-morbid conditions 4) Administration of psychotropic and psychotherapeutic interventions  Tyler Lambert,Tyler Lambert 03/25/2013 11:44 PM  03/26/2013   Psychiatry note:  Mr Tribbett  remains suicidal.  He has been on Paxil and clonazepam for aver a month as well as therapy and is still suicidal daily.  No particular  reason, things are OK except for the problems the depression is causing, he says.  Says he still cannot contract for safety, so inpatient admission to [facility] is recommended if possible and if not to whatever facility can be found.

## 2013-03-25 NOTE — ED Provider Notes (Addendum)
CSN: [id number]     Arrival date & time 03/25/13  1928 History     First MD Initiated Contact with Patient 03/25/13 2002     Chief Complaint  Patient presents with  . Medical Clearance   (Consider location/radiation/quality/duration/timing/severity/associated sxs/prior Treatment) The history is provided by the patient.  pt with hx depression, anxiety, states feels more depressed in the past week. States on and off problems w depression x years. States unsure why feeling worse know, no specific exacerbating or alleviating factors. States intermittent thoughts of suicide, but no specific plan, no attempt. No thoughts of harm to others. Pt denies any recent change in meds. States compliant w normal meds. Family member states seemed to be functioning better in past when on adderall, but that that medication was stopped a long time ago. Denies problems w substance abuse. States physical health at baseline, no recent illness or injury. States sleeping well, eating well.     Past Medical History  Diagnosis Date  . Anxiety   . Depression   . Acid reflux   . Irritable bowel syndrome   . PTSD (post-traumatic stress disorder)    Past Surgical History  Procedure Laterality Date  . Upper gastrointestinal endoscopy     History reviewed. No pertinent family history. History  Substance Use Topics  . Smoking status: Never Smoker   . Smokeless tobacco: Current User    Types: Snuff  . Alcohol Use: Yes    Review of Systems  Constitutional: Negative for fever and chills.  HENT: Negative for neck pain.   Eyes: Negative for redness.  Respiratory: Negative for shortness of breath.   Cardiovascular: Negative for chest pain.  Gastrointestinal: Negative for abdominal pain.  Genitourinary: Negative for flank pain.  Musculoskeletal: Negative for back pain.  Skin: Negative for rash.  Neurological: Negative for headaches.  Hematological: Does not bruise/bleed easily.  Psychiatric/Behavioral:  Positive for dysphoric mood. The patient is nervous/anxious.     Allergies  Divalproex sodium  Home Medications   Current Outpatient Rx  Name  Route  Sig  Dispense  Refill  . clonazePAM (KLONOPIN) 1 MG tablet   Oral   Take 1 mg by mouth 2 (two) times daily as needed for anxiety.          . diphenhydramine-acetaminophen (TYLENOL PM) 25-500 MG TABS   Oral   Take 1 tablet by mouth at bedtime as needed (pain and sleep).          Marland Kitchen PARoxetine (PAXIL) 40 MG tablet   Oral   Take 60 mg by mouth at bedtime. 40 mg and half of a 40 mg tablet         . traMADol (ULTRAM) 50 MG tablet   Oral   Take 50 mg by mouth every 6 (six) hours as needed for pain.          Marland Kitchen zolpidem (AMBIEN) 10 MG tablet   Oral   Take 10 mg by mouth at bedtime.          BP 121/83  Pulse 120  Temp(Src) 97.7 F (36.5 C) (Oral)  Resp 20  SpO2 96% Physical Exam  Nursing note and vitals reviewed. Constitutional: He is oriented to person, place, and time. He appears well-developed and well-nourished. No distress.  HENT:  Head: Atraumatic.  Eyes: Conjunctivae are normal. Pupils are equal, round, and reactive to light. No scleral icterus.  Neck: Neck supple. No tracheal deviation present.  Cardiovascular: Regular rhythm, normal heart sounds and intact  distal pulses.   Pulmonary/Chest: Effort normal. No accessory muscle usage. No respiratory distress.  Abdominal: Soft. He exhibits no distension. There is no tenderness.  Musculoskeletal: Normal range of motion. He exhibits no edema and no tenderness.  Neurological: He is alert and oriented to person, place, and time.  Steady gait.   Skin: Skin is warm and dry.  Psychiatric:  Mildly anxious, appears stressed. Denies SI/HI currently, although acknowledges recent passing SI without plan or attempt.     ED Course   Procedures (including critical care time)  Results for orders placed during the hospital encounter of 03/25/13  ACETAMINOPHEN LEVEL       Result Value Range   Acetaminophen (Tylenol), Serum 18.3  10 - 30 ug/mL  CBC      Result Value Range   WBC 9.3  4.0 - 10.5 K/uL   RBC 4.33  4.22 - 5.81 MIL/uL   Hemoglobin 13.5  13.0 - 17.0 g/dL   HCT 29.5 (*) 28.4 - 13.2 %   MCV 84.5  78.0 - 100.0 fL   MCH 31.2  26.0 - 34.0 pg   MCHC 36.9 (*) 30.0 - 36.0 g/dL   RDW 44.0  10.2 - 72.5 %   Platelets 258  150 - 400 K/uL  COMPREHENSIVE METABOLIC PANEL      Result Value Range   Sodium 136  135 - 145 mEq/L   Potassium 3.9  3.5 - 5.1 mEq/L   Chloride 100  96 - 112 mEq/L   CO2 24  19 - 32 mEq/L   Glucose, Bld 103 (*) 70 - 99 mg/dL   BUN 13  6 - 23 mg/dL   Creatinine, Ser 3.66  0.50 - 1.35 mg/dL   Calcium 44.0  8.4 - 34.7 mg/dL   Total Protein 7.2  6.0 - 8.3 g/dL   Albumin 4.3  3.5 - 5.2 g/dL   AST 16  0 - 37 U/L   ALT 17  0 - 53 U/L   Alkaline Phosphatase 47  39 - 117 U/L   Total Bilirubin 0.6  0.3 - 1.2 mg/dL   GFR calc non Af Amer >90  >90 mL/min   GFR calc Af Amer >90  >90 mL/min  ETHANOL      Result Value Range   Alcohol, Ethyl (B) <11  0 - 11 mg/dL  SALICYLATE LEVEL      Result Value Range   Salicylate Lvl <2.0 (*) 2.8 - 20.0 mg/dL  URINE RAPID DRUG SCREEN (HOSP PERFORMED)      Result Value Range   Opiates NONE DETECTED  NONE DETECTED   Cocaine NONE DETECTED  NONE DETECTED   Benzodiazepines NONE DETECTED  NONE DETECTED   Amphetamines NONE DETECTED  NONE DETECTED   Tetrahydrocannabinol NONE DETECTED  NONE DETECTED   Barbiturates NONE DETECTED  NONE DETECTED      MDM  Labs.  Reviewed nursing notes and prior charts for additional history.   Discussed w act team, psych eval pending.   Pt alert, content.   Psych eval remains pending.   2300 psych assessment pending - signed out to oncoming provider to follow up with psych assessment, recheck pt/dispo appropriately.      Suzi Roots, MD 03/25/13 [phone]

## 2013-03-25 NOTE — ED Notes (Signed)
Patient states that for years on and off he has had thoughts of SI. Patient has a plan however unable to repeat for the RN

## 2013-03-26 ENCOUNTER — Inpatient Hospital Stay (HOSPITAL_COMMUNITY)
Admission: EM | Admit: 2013-03-26 | Discharge: 2013-03-31 | DRG: 885 | Disposition: A | Discharge status: Home / Self Care | Payer: No Typology Code available for payment source | Source: Intra-hospital | Attending: Psychiatry | Admitting: Psychiatry

## 2013-03-26 ENCOUNTER — Encounter (HOSPITAL_COMMUNITY): Payer: Self-pay | Admitting: *Deleted

## 2013-03-26 DIAGNOSIS — Z79899 Other long term (current) drug therapy: Secondary | ICD-10-CM

## 2013-03-26 DIAGNOSIS — K589 Irritable bowel syndrome without diarrhea: Secondary | ICD-10-CM | POA: Diagnosis present

## 2013-03-26 DIAGNOSIS — R45851 Suicidal ideations: Secondary | ICD-10-CM

## 2013-03-26 DIAGNOSIS — F329 Major depressive disorder, single episode, unspecified: Secondary | ICD-10-CM | POA: Diagnosis present

## 2013-03-26 DIAGNOSIS — F4310 Post-traumatic stress disorder, unspecified: Secondary | ICD-10-CM | POA: Diagnosis present

## 2013-03-26 DIAGNOSIS — F332 Major depressive disorder, recurrent severe without psychotic features: Principal | ICD-10-CM | POA: Diagnosis present

## 2013-03-26 DIAGNOSIS — K219 Gastro-esophageal reflux disease without esophagitis: Secondary | ICD-10-CM | POA: Diagnosis present

## 2013-03-26 DIAGNOSIS — F909 Attention-deficit hyperactivity disorder, unspecified type: Secondary | ICD-10-CM | POA: Diagnosis present

## 2013-03-26 MED ORDER — CLONAZEPAM 1 MG PO TABS
1.0000 mg | ORAL_TABLET | Freq: Two times a day (BID) | ORAL | Status: DC
  Administered 2013-03-26 – 2013-03-31 (×10): 1 mg via ORAL
  Filled 2013-03-26 (×10): qty 1

## 2013-03-26 MED ORDER — PAROXETINE HCL 20 MG PO TABS
40.0000 mg | ORAL_TABLET | Freq: Every day | ORAL | Status: DC
  Administered 2013-03-26 – 2013-03-31 (×6): 40 mg via ORAL
  Filled 2013-03-26 (×4): qty 2
  Filled 2013-03-26: qty 28
  Filled 2013-03-26 (×2): qty 2
  Filled 2013-03-26: qty 1
  Filled 2013-03-26 (×2): qty 2

## 2013-03-26 MED ORDER — ACETAMINOPHEN 325 MG PO TABS
650.0000 mg | ORAL_TABLET | Freq: Four times a day (QID) | ORAL | Status: DC | PRN
  Administered 2013-03-27 – 2013-03-30 (×6): 650 mg via ORAL

## 2013-03-26 MED ORDER — QUETIAPINE FUMARATE 50 MG PO TABS
50.0000 mg | ORAL_TABLET | Freq: Every day | ORAL | Status: DC
  Administered 2013-03-26 – 2013-03-30 (×5): 50 mg via ORAL
  Filled 2013-03-26: qty 14
  Filled 2013-03-26 (×7): qty 1

## 2013-03-26 MED ORDER — ALUM & MAG HYDROXIDE-SIMETH 200-200-20 MG/5ML PO SUSP
30.0000 mL | ORAL | Status: DC | PRN
  Administered 2013-03-27 – 2013-03-29 (×3): 30 mL via ORAL

## 2013-03-26 MED ORDER — MAGNESIUM HYDROXIDE 400 MG/5ML PO SUSP: 30.0000 mL | Freq: Every day | ORAL | Status: DC | PRN

## 2013-03-26 NOTE — ED Notes (Signed)
Spoke with Lupita Leash, Consulting civil engineer at [facility], [facility] ready for pt. At 1500

## 2013-03-26 NOTE — Tx Team (Signed)
Initial Interdisciplinary Treatment Plan  PATIENT STRENGTHS: (choose at least two) Average or above average intelligence Capable of independent living General fund of knowledge Physical Health Religious Affiliation Supportive family/friends  PATIENT STRESSORS: Financial difficulties Medication change or noncompliance   PROBLEM LIST: Problem List/Patient Goals Date to be addressed Date deferred Reason deferred Estimated date of resolution  Depression 03/26/2013     anxiety 03/26/2013     Suicidal ideations 03/26/2013                                          DISCHARGE CRITERIA:  Ability to meet basic life and health needs Medical problems require only outpatient monitoring Need for constant or close observation no longer present Reduction of life-threatening or endangering symptoms to within safe limits  PRELIMINARY DISCHARGE PLAN: Attend aftercare/continuing care group Outpatient therapy Return to previous living arrangement  PATIENT/FAMIILY INVOLVEMENT: This treatment plan has been presented to and reviewed with the patient, Tyler Lambert.  The patient and family have been given the opportunity to ask questions and make suggestions.  Leighton Parody M 03/26/2013, 6:20 PM

## 2013-03-26 NOTE — Progress Notes (Signed)
Pt observed in the dayroom playing solitaire and watching TV.  Pt was not interacting with any other patients.  Pt reports he was admitted today.  He says he feels safe here.  Prior to going to the hospital, pt was having thoughts to go into the wood and shoot himself.  He says he has a 38 yo son who is his reason to live.  He reports that his depression has been so bad that all he wants to do is lay in the bed.  He feels guilty, because his son wants him to go outside to play with him and he just wants to lay in bed.  He says when the suicidal thoughts came, he knew he needed to get help.  Pt contracts for safety on the unit.  He denies HI/AV.  Pt was encouraged to make his needs known to staff.  Pt voiced understanding and had no needs or concerns at this time.  Support and encouragement offered.  Safety maintained with q15 minute checks.

## 2013-03-26 NOTE — [facility] Counselor (Signed)
Writer consulted with Dr. Ladona Ridgel and Denice Bors, NP regarding the patient needing inpatient hospitalization.  Writer faxed the patients referrals information to [facility] [place], [facility] and [facility].

## 2013-03-26 NOTE — Progress Notes (Signed)
Patient ID: Tyler Lambert, male   DOB: [birth year], 37 y.o.   MRN: [id number] Patient reports increased depression and anxiety and had SI with a plan to shoot himself in the woods; patient does have access to guns at home; patient reports not wanting to do anything but lay in the bed and he has a 85 year old son who wants to always go outside and that he can not keep a job because of the depression; patient rates anxiety 7/10; depression 6/10; hopelessness 9/10; patient has a history of adhd, ibs, depression,knee pain, and anxiety; patient has not been on his adderall in a while and he takes klonopin, ambien, paxil, and tramdol at home; patients etoh level is negative and his uds is negative;

## 2013-03-26 NOTE — B[facility] Counselor (Signed)
Writer consulted with Dr. Ladona Ridgel and the NP, [facility] regarding the patient being transferred to [facility].  Writer informed the patient that he was accepted to [facility].    Writer completed the support paperwork with the patient.  Writer faxed the support paperwork to [facility].  Writer notified the nurse working with the patient that he will be discharged to [facility] do that she can call report on the patient.

## 2013-03-26 NOTE — Progress Notes (Signed)
Report given to Drenda Freeze. RN at 1700 hrs

## 2013-03-26 NOTE — ED Notes (Signed)
Report called to [place], Charity fundraiser at [facility].  BH ready for pt. To be transported.

## 2013-03-26 NOTE — B[facility] Counselor (Signed)
Patient has been accepted to [facility].  The accepting doctor is  Dr. Elsie Saas and the patient is assigned to bed 507-2.

## 2013-03-26 NOTE — ED Notes (Signed)
Pt. Signed all D/C papers, ambulated out of psych ED without any difficulty.  Pt.'s ACT/AVS papers and 1 bag of belongings and 1 green backpack taken with pt. To [facility] by [facility] [employer]/[facility].

## 2013-03-26 NOTE — Progress Notes (Signed)
[facility] provided patient with a [facility] Orange Card application, highlighting [facility].  °

## 2013-03-27 DIAGNOSIS — F332 Major depressive disorder, recurrent severe without psychotic features: Principal | ICD-10-CM

## 2013-03-27 DIAGNOSIS — F411 Generalized anxiety disorder: Secondary | ICD-10-CM

## 2013-03-27 DIAGNOSIS — F988 Other specified behavioral and emotional disorders with onset usually occurring in childhood and adolescence: Secondary | ICD-10-CM

## 2013-03-27 MED ORDER — NICOTINE POLACRILEX 2 MG MT GUM
2.0000 mg | CHEWING_GUM | OROMUCOSAL | Status: DC | PRN
  Administered 2013-03-27 – 2013-03-31 (×15): 2 mg via ORAL

## 2013-03-27 MED ORDER — AMPHETAMINE-DEXTROAMPHET ER 20 MG PO CP24
20.0000 mg | ORAL_CAPSULE | Freq: Every day | ORAL | Status: DC
  Administered 2013-03-28 – 2013-03-31 (×4): 20 mg via ORAL
  Filled 2013-03-27 (×4): qty 1

## 2013-03-27 NOTE — [facility] Group Notes (Signed)
[facility] LCSW Group Therapy  03/27/2013 3:13 PM  Type of Therapy:  Group Therapy  Participation Level:  Active  Participation Quality:  Appropriate  Affect:  Appropriate, Depressed and Flat  Cognitive:  Alert  Insight:  Developing/Improving and Engaged  Engagement in Therapy:  Developing/Improving and Engaged  Modes of Intervention:  Discussion, Education, Exploration, Orientation, Rapport Building and Support  Summary of Progress/Problems:  Patient shared guilt and shame are emotions he has to learn to regulate.  He shared he tends to isolate to his home and keep the shades drawn as a away of not dealing with his feelings.  Patient shared he admitted to the hospital because he wants his life to change and to be available for his 60 year old son.  Wynn Banker 03/27/2013, 3:13 PM

## 2013-03-27 NOTE — [facility] Group Notes (Signed)
[facility] LCSW Aftercare Discharge Planning Group Note   03/27/2013 3:12 PM    Participation Quality:  Appropraite  Mood/Affect:  Appropriate  Depression Rating:  7  Anxiety Rating:  7  Thoughts of Suicide:  No  Will you contract for safety?   NA  Current AVH:  No  Plan for Discharge/Comments:  Patient attending discharge planning group and actively participated in group.  He advised he is not being seen for outpatient services and is open to being referred for counseling and medication management.  CSW provided all participants with daily workbook and information on services offered by [facility].  Transportation Means: Patient has transportation.   Supports:  Patient has a support system.   Brittania Sudbeck, [name]

## 2013-03-27 NOTE — Progress Notes (Signed)
D:  Tyler Lambert reports that he slept good and that his appetite is good.  He states that his energy level is low and his ability to pay attention is poor.  He is rating depression at 8/10 and hopelessness at 9/10.  He reports off and on thoughts of suicide, but does contract for safety.  He is attending groups, but seems very flat and depressed.  A:  Medications administered as ordered.  Emotional support provided.  Safety checks q 15 minutes. R:  Safety maintained on unit.

## 2013-03-27 NOTE — Progress Notes (Signed)
Adult Psychoeducational Group Note  Date:  03/27/2013 Time:  7:20 PM  Group Topic/Focus:  Crisis Planning:   The purpose of this group is to help patients create a crisis plan for use upon discharge or in the future, as needed.  Participation Level:  Did Not Attend  Dalia Heading 03/27/2013, 7:20 PM

## 2013-03-27 NOTE — Progress Notes (Signed)
Pt reports his day has been ok.  He is still depressed/anxious, but denies SI/HI/AV at this time.  Pt states he now feels silly about coming in and for his suicidal thoughts that brought him in.  Spent time talking with pt about the circumstances behind his admission and assured him that coming for help was the right thing to do.  He mentioned his 38 yo son again and how much he wants to get better so he can be a good father.  Support and encouragement offered.  Also encouraged to participate in unit activities and not isolate himself.  Pt voices no other needs or concerns at this time.  Safety maintained with q15 minute checks.
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
## 2013-03-27 NOTE — Progress Notes (Signed)
Recreation Therapy Notes  Date: 08.13.2014 Time: 3:00pm Location: [address] Dayroom  Group Topic: Communication, Team Building, Problem Solving  Goal Area(s) Addresses:  Patient will be able to recognize use of communication, team building and problem solving during course of group activity. Patient will verbalize need for communication, team building and problem solving to make group activity successful.  Patient will verbalize benefit of communication, team building and problem solving to post d/c goals.   Behavioral Response: Engaged, Appropriate  Intervention: Team Building Exercise  Activity: [employer]. Patients were given the following supplied: 5 rubber bands, 5 paper clips, 3 index cards, 5 drinking straws and a length of masking tape. In teams of 3, using the provided materials patients were asked to build a structure that could launch a ping pong ball approximately 10 feet.   Education: Customer service manager, Discharge Planning, Relapse Prevention  Education Outcome: Needs additional education.   Clinical Observations/Feedback: Patient contributed to opening discussion, defining personal development for group as improved quality of life. Patient participated in activity, attempting to build launching mechanism, but ultimately letting team mate take control and lead of project. Patient identify problem solving as a needed skill to make the activity successful. Patient nodded in agreement when peer identified communication as necessary skill. Patient made connection between healthy communication and building support system. Patient additionally appeared to actively listened to points raised by peers, as he made appropriate eye contact and nodded in agreement.   Marykay Lex Charrie Mcconnon, LRT/CTRS  Placido Hangartner L 03/27/2013 5:20 PM

## 2013-03-27 NOTE — Progress Notes (Signed)
Adult Psychoeducational Group Note  Date:  03/27/2013 Time:  9:17 PM  Group Topic/Focus:  Wrap-Up Group:   The focus of this group is to help patients review their daily goal of treatment and discuss progress on daily workbooks.  Participation Level:  Active  Participation Quality:  Appropriate  Affect:  Appropriate  Cognitive:  Appropriate  Insight: Improving  Engagement in Group:  Improving  Modes of Intervention:  Exploration  Additional Comments:  Pt stated that he learned about communication on today. Pt stated that one coping skill is to learn/be able to talk to other people instead of keeping feelings bottled inside. Patient stated that two people he can talk to is his brother and cousin.  Kaydi Kley, Randal Buba 03/27/2013, 9:17 PM

## 2013-03-27 NOTE — [facility] Suicide Risk Assessment (Signed)
Suicide Risk Assessment  Admission Assessment     Nursing information obtained from:  Patient Demographic factors:  Male;Adolescent or young adult;Divorced or widowed;Caucasian;Low socioeconomic status;Living alone;Unemployed;Access to firearms Current Mental Status:  Suicidal ideation indicated by patient;Suicide plan;Self-harm thoughts Loss Factors:  Decrease in vocational status;Financial problems / change in socioeconomic status Historical Factors:  Family history of mental illness or substance abuse Risk Reduction Factors:  Responsible for children under 67 years of age;Sense of responsibility to family;Religious beliefs about death  CLINICAL FACTORS:   Severe Anxiety and/or Agitation Depression:   Anhedonia Hopelessness Impulsivity Insomnia Recent sense of peace/wellbeing Severe Dysthymia Alcohol/Substance Abuse/Dependencies More than one psychiatric diagnosis Unstable or Poor Therapeutic Relationship Previous Psychiatric Diagnoses and Treatments Medical Diagnoses and Treatments/Surgeries  COGNITIVE FEATURES THAT CONTRIBUTE TO RISK:  Closed-mindedness Loss of executive function Polarized thinking Thought constriction (tunnel vision)    SUICIDE RISK:   Moderate:  Frequent suicidal ideation with limited intensity, and duration, some specificity in terms of plans, no associated intent, good self-control, limited dysphoria/symptomatology, some risk factors present, and identifiable protective factors, including available and accessible social support.  PLAN OF CARE:   I certify that inpatient services furnished can reasonably be expected to improve the patient's condition.   Nehemiah Settle., MD 03/27/2013, 2:06 PM
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
## 2013-03-27 NOTE — Progress Notes (Signed)
Adult Psychoeducational Group Note  Date:  03/27/2013 Time:  1000AM Group Topic/Focus:  Therapeutic Activity  Participation Level:  Active  Participation Quality:  Appropriate and Attentive  Affect:  Appropriate  Cognitive:  Alert and Appropriate  Insight: Good  Engagement in Group:  Engaged  Modes of Intervention:  Discussion  Additional Comments:  Pt. Was attentive and appropriate during today's group activity. Pt. Was able to participate in build a bear where he engaged in positive communication and interaction with other peers. Pt was able to solve words that subject deal with personal development.   Bing Plume D 03/27/2013, 6:19 PM

## 2013-03-28 DIAGNOSIS — F411 Generalized anxiety disorder: Secondary | ICD-10-CM

## 2013-03-28 DIAGNOSIS — F332 Major depressive disorder, recurrent severe without psychotic features: Secondary | ICD-10-CM

## 2013-03-28 DIAGNOSIS — F909 Attention-deficit hyperactivity disorder, unspecified type: Secondary | ICD-10-CM

## 2013-03-28 NOTE — Progress Notes (Signed)
D:  Patient's self inventory sheet, patient sleeps fair, good appetite, low energy level, poor attention span.  Rated depression, hopeless, anxiety #5.  Denied SI.  Has experienced knee pain #5 pain worst.   After discharge, plans to actually live his life.  No problems taking meds after discharge. A:  Medications administered per MD orders.  Emotional support and encouragement given patient. R:  Denied SI and HI.  Denied A/V hallucinations.  Will continue to monitor patient for safety with 15 minute checks.  Safety maintained.

## 2013-03-28 NOTE — Progress Notes (Signed)
Adult Psychoeducational Group Note  Date:  03/28/2013 Time:  11:38 PM  Group Topic/Focus:  Wrap-Up Group:   The focus of this group is to help patients review their daily goal of treatment and discuss progress on daily workbooks.  Participation Level:  Active  Participation Quality:  Appropriate  Affect:  Appropriate  Cognitive:  Appropriate  Insight: Appropriate  Engagement in Group:  Engaged  Modes of Intervention:  Activity  Additional Comments:  Pt attended karaoke, was attentive and engaged   Jacquelin Krajewski 03/28/2013, 11:38 PM

## 2013-03-28 NOTE — [facility] Group Notes (Signed)
[facility] LCSW Group Therapy  [facility] 1:15 - 2:30 PM  03/28/2013 2:53 PM   Type of Therapy:  Group Therapy  Participation Level:  Active  Participation Quality:  Attentive  Affect:  Appropriate  Cognitive:  Appropriate  Insight:  Developing/Improving and Engaged  Engagement in Therapy:  Developing/Improving Engaged  Modes of Intervention:  Discussion, Education, Exploration, Problem-Solving, Rapport Building, Support   Summary of Progress/Problems:  Patient listened attentively to speaker from [facility]. He asked questions about the services and stated he plans to attend.  Patient shared he really wants to get better.  Wynn Banker 03/28/2013   2:53 PM

## 2013-03-28 NOTE — Progress Notes (Signed)
Adult Psychoeducational Group Note  Date:  03/28/2013 Time:  4:47 PM  Group Topic/Focus:  Overcoming Stress:   The focus of this group is to define stress and help patients assess their triggers.  Participation Level:  Active  Participation Quality:  Appropriate and Attentive  Affect:  Appropriate  Cognitive:  Alert and Oriented  Insight: Good  Engagement in Group:  Engaged and Improving  Modes of Intervention:  Discussion, Education and Support  Additional Comments:    Reynolds Bowl 03/28/2013, 4:47 PM

## 2013-03-28 NOTE — Progress Notes (Signed)
Adult Psychoeducational Group Note  Date:  03/28/2013 Time:  9:00 AM  Group Topic/Focus:  Self Esteem Action Plan:   The focus of this group is to help patients create a plan to continue to build self-esteem after discharge.  Participation Level:  Active  Participation Quality:  Appropriate and Attentive  Affect:  Appropriate  Cognitive:  Alert and Oriented  Insight: Appropriate and Good  Engagement in Group:  Engaged  Modes of Intervention:  Discussion  Additional Comments:  Pt. verbalized that hiking is an activity that he enjoys and it makes him feel good about himself.  Harold Barban E 03/28/2013, 10:30 AM

## 2013-03-28 NOTE — Progress Notes (Addendum)
Patient ID: Tyler Lambert, male   DOB: August 22, 1975, 37 y.o.   MRN: [id number] Pt attended and participated in the tx activity, "Childhood Play". Pt described fishing, camping and four-wheeling as things he enjoyed as a child. As an adult he plans to pick up camping with his son, who he described is a reason to get better and work on living. Pt is very soft spoken yet engaged in group.

## 2013-03-28 NOTE — Progress Notes (Signed)
Recreation Therapy Notes  Date: 08.14.2014 Time: 2:45pm Location: [address] Dayroom   Group Topic: Animal Assisted Activities  Goal Area(s) Addresses:  Patient will interact appropriately with dog team.    Behavioral Response: Appropriate.   Education: Engineer, petroleum.   Education Outcome: Acknowledges understanding  Clinical Observations/Feedback: Dog Team: Runner, broadcasting/film/video. Patient interacted appropriately with peer, dog team, LRT and MHT.    Marykay Lex Garin Mata, LRT/CTRS  Jearl Klinefelter 03/28/2013 7:35 PM

## 2013-03-29 DIAGNOSIS — F909 Attention-deficit hyperactivity disorder, unspecified type: Secondary | ICD-10-CM

## 2013-03-29 MED ORDER — PANTOPRAZOLE SODIUM 40 MG PO TBEC
40.0000 mg | DELAYED_RELEASE_TABLET | Freq: Every day | ORAL | Status: DC
  Administered 2013-03-29 – 2013-03-31 (×3): 40 mg via ORAL
  Filled 2013-03-29 (×5): qty 1

## 2013-03-29 MED ORDER — NAPROXEN 500 MG PO TABS
500.0000 mg | ORAL_TABLET | Freq: Two times a day (BID) | ORAL | Status: DC
  Administered 2013-03-29 – 2013-03-31 (×4): 500 mg via ORAL
  Filled 2013-03-29 (×8): qty 1

## 2013-03-29 MED ORDER — NAPROXEN SODIUM 550 MG PO TABS: 550.0000 mg | ORAL_TABLET | Freq: Two times a day (BID) | ORAL | Status: DC

## 2013-03-29 NOTE — Progress Notes (Signed)
Adult Psychoeducational Group Note  Date:  03/29/2013 Time:  5:16 PM  Group Topic/Focus:  Relapse Prevention Planning:   The focus of this group is to define relapse and discuss the need for planning to combat relapse.  Participation Level:  Active  Participation Quality:  Appropriate and Supportive  Affect:  Appropriate  Cognitive:  Appropriate  Insight: Appropriate  Engagement in Group:  Engaged and Supportive  Modes of Intervention:  Discussion, Education, Socialization and Support  Additional Comments:  Pt stated he wanted to exercise more as a coping skill in order to help prevent relapse in the future.  Caswell Corwin 03/29/2013, 5:16 PM

## 2013-03-29 NOTE — Tx Team (Signed)
Interdisciplinary Treatment Plan Update   Date Reviewed:  03/29/2013  Time Reviewed:  9:58 AM  Progress in Treatment:   Attending groups: Yes Participating in groups: Yes Taking medication as prescribed: Yes  Tolerating medication: Yes Family/Significant other contact made: Yes, contact made with mother. Patient understands diagnosis: Yes  Discussing patient identified problems/goals with staff: Yes Medical problems stabilized or resolved: Yes Denies suicidal/homicidal ideation: Yes Patient has not harmed self or others: Yes  For review of initial/current patient goals, please see plan of care.  Estimated Length of Stay:  2 days  Reasons for Continued Hospitalization:  Anxiety Depression Medication stabilization  New Problems/Goals identified:    Discharge Plan or Barriers:   Home with outpatient follow up [facility]  Additional Comments:  Discharge Sunday  Attendees:  Patient:  03/29/2013 9:58 AM   Signature: Mervyn Gay, MD 03/29/2013 9:58 AM  Signature:  Verne Spurr, PA 03/29/2013 9:58 AM  Signature: Harold Barban, RN 03/29/2013 9:58 AM  Signature: Lamount Cranker, RN 03/29/2013 9:58 AM  Signature:   03/29/2013 9:58 AM  Signature:  Juline Patch, LCSW 03/29/2013 9:58 AM  Signature:  Reyes Ivan, LCSW 03/29/2013 9:58 AM  Signature:  Maseta Dorley,Care Coordinator 03/29/2013 9:58 AM  Signature: 03/29/2013 9:58 AM  Signature:    Signature:    Signature:      Scribe for Treatment Team:   Juline Patch,  03/29/2013 9:58 AM

## 2013-03-29 NOTE — Progress Notes (Signed)
D: Patient appropriate and cooperative with staff and peers. Patient's affect/mood is depressed, but anxious at times. Complained of bilateral knee pain 4-5/10. He reported on the self inventory sheet that his sleep is poor, appetite and ability to pay attention are both good and energy level is normal. Patient rated depression and feelings of hopelessness "4". He's attending groups/meals and compliant with medication regimen.  A: Support and encouragement provided to patient. Administered scheduled medications per ordering MD. PRN Tylenol given for pain. Monitor Q15 minute checks for safety.  R: Patient receptive. Denies SI/HI/AVH. Patient remains safe on the unit.

## 2013-03-29 NOTE — Progress Notes (Signed)
[facility] MD Progress Note  03/29/2013 9:07 AM Tyler Lambert  MRN:  [id number] Subjective:  Met with patient who was up and active in the unit miliue. He states he didn't sleep too good, but his appetite was increasing, Reports that his depression is a 5/10 and that his suicidal ideation is decreasing. Stated his anxiety is a 9/10 but he reported he had just started his Adderall for the first time in 5 months. He states he needs the klonopin for the social anxiety. 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 ADHD, GAD  ADL's:  Intact  Sleep: Poor  Appetite:  Good  Suicidal Ideation:  decreasing Homicidal Ideation:  decreasing AEB (as evidenced by):  Psychiatric Specialty Exam: Review of Systems  Constitutional: Negative.  Negative for fever, chills, weight loss, malaise/fatigue and diaphoresis.  HENT: Negative for congestion and sore throat.   Eyes: Negative for blurred vision, double vision and photophobia.  Respiratory: Negative for cough, shortness of breath and wheezing.   Cardiovascular: Negative for chest pain, palpitations and PND.  Gastrointestinal: Negative for heartburn, nausea, vomiting, abdominal pain, diarrhea and constipation.  Musculoskeletal: Negative for myalgias, joint pain and falls.  Neurological: Negative for dizziness, tingling, tremors, sensory change, speech change, focal weakness, seizures, loss of consciousness, weakness and headaches.  Endo/Heme/Allergies: Negative for polydipsia. Does not bruise/bleed easily.  Psychiatric/Behavioral: Negative for depression, suicidal ideas, hallucinations, memory loss and substance abuse. The patient is not nervous/anxious and does not have insomnia.     Blood pressure 123/84, pulse 98, temperature 97.7 F (36.5 C), temperature source Oral, resp. rate 20, height 5\' 8"  (1.727 m), weight 79.833 kg (176 lb).Body mass index is 26.77 kg/(m^2).  General Appearance: Fairly Groomed  Patent attorney::  Poor  Speech:  Slow  Volume:   Decreased  Mood:  Anxious and Depressed  Affect:  Constricted  Thought Process:  Goal Directed  Orientation:  Full (Time, Place, and Person)  Thought Content:  WDL  Suicidal Thoughts:  Yes.  without intent/plan  Homicidal Thoughts:  No  Memory:  Immediate;   Fair  Judgement:  Impaired  Insight:  Lacking  Psychomotor Activity:  Decreased  Concentration:  Poor  Recall:  Fair  Akathisia:  No  Handed:  Right  AIMS (if indicated):     Assets:  Communication Skills Desire for Improvement  Sleep:  Number of Hours: 3.75   Current Medications: Current Facility-Administered Medications  Medication Dose Route Frequency Provider Last Rate Last Dose  . acetaminophen (TYLENOL) tablet 650 mg  650 mg Oral Q6H PRN Kerry Hough, PA-C   650 mg at 03/29/13 0756  . alum & mag hydroxide-simeth (MAALOX/MYLANTA) 200-200-20 MG/5ML suspension 30 mL  30 mL Oral Q4H PRN Kerry Hough, PA-C   30 mL at 03/28/13 1945  . amphetamine-dextroamphetamine (ADDERALL XR) 24 hr capsule 20 mg  20 mg Oral Daily Nehemiah Settle, MD   20 mg at 03/29/13 0827  . clonazePAM (KLONOPIN) tablet 1 mg  1 mg Oral BID Kerry Hough, PA-C   1 mg at 03/29/13 0754  . magnesium hydroxide (MILK OF MAGNESIA) suspension 30 mL  30 mL Oral Daily PRN Kerry Hough, PA-C      . nicotine polacrilex (NICORETTE) gum 2 mg  2 mg Oral PRN Nehemiah Settle, MD   2 mg at 03/29/13 0757  . PARoxetine (PAXIL) tablet 40 mg  40 mg Oral Daily Kerry Hough, PA-C   40 mg at 03/29/13 0755  . QUEtiapine (SEROQUEL)  tablet 50 mg  50 mg Oral QHS Kerry Hough, PA-C   50 mg at 03/28/13 2135    Lab Results: No results found for this or any previous visit (from the past 48 hour(s)).  Physical Findings: AIMS: Facial and Oral Movements Muscles of Facial Expression: None, normal Lips and Perioral Area: None, normal Jaw: None, normal Tongue: None, normal,Extremity Movements Upper (arms, wrists, hands, fingers): None, normal Lower  (legs, knees, ankles, toes): None, normal, Trunk Movements Neck, shoulders, hips: None, normal, Overall Severity Severity of abnormal movements (highest score from questions above): None, normal Incapacitation due to abnormal movements: None, normal Patient's awareness of abnormal movements (rate only patient's report): No Awareness, Dental Status Current problems with teeth and/or dentures?: No Does patient usually wear dentures?: No  CIWA:  CIWA-Ar Total: 1 COWS:  COWS Total Score: 2  Treatment Plan Summary: Daily contact with patient to assess and evaluate symptoms and progress in treatment Medication management  Plan: 1. Continue crisis management and stabilization. 2. Medication management to reduce current symptoms to base line and improve patient's overall level of functioning 3. Treat health problems as indicated. 4. Develop treatment plan to decrease risk of relapse upon discharge and the need for     readmission. 5. Psycho-social education regarding relapse prevention and self care. 6. Health care follow up as needed for medical problems. 7. Continue home medications where appropriate. 8. ELOS: 1-2 days   Medical Decision Making Problem Points:  Established problem, stable/improving (1) Data Points:  Review of medication regiment & side effects (2)  I certify that inpatient services furnished can reasonably be expected to improve the patient's condition.  Rona Ravens. Kerie Badger RPAC 9:12 AM 03/28/2013

## 2013-03-29 NOTE — Progress Notes (Signed)
Patient ID: Tyler Lambert, male   DOB: 06-24-1976, 37 y.o.   MRN: [id number]    D: Pt observed sleeping in bed with eyes closed. RR even and unlabored. No distress noted  .  A: Q 15 minute checks were done for safety.  R: safety maintained on unit.

## 2013-03-29 NOTE — [facility] Suicide Risk Assessment (Signed)
[facility] INPATIENT:  Family/Significant Other Suicide Prevention Education  Suicide Prevention Education:  Education Completed; Codi Kertz, [phone];  has been identified by the patient as the family member/significant other with whom the patient will be residing, and identified as the person(s) who will aid the patient in the event of a mental health crisis (suicidal ideations/suicide attempt).  With written consent from the patient, the family member/significant other has been provided the following suicide prevention education, prior to the and/or following the discharge of the patient.  The suicide prevention education provided includes the following:  Suicide risk factors  Suicide prevention and interventions  National Suicide Hotline telephone number  [facility] assessment telephone number  [facility] Emergency Assistance 911  [facility] and/or Residential Mobile Crisis Unit telephone number  Request made of family/significant other to:  Remove weapons (e.g., guns, rifles, knives), all items previously/currently identified as safety concern.  Mother advised patient gun has been secured.    Remove drugs/medications (over-the-counter, prescriptions, illicit drugs), all items previously/currently identified as a safety concern.  The family member/significant other verbalizes understanding of the suicide prevention education information provided.  The family member/significant other agrees to remove the items of safety concern listed above.  Wynn Banker 03/29/2013, 8:37 AM

## 2013-03-29 NOTE — Progress Notes (Signed)
Adult Psychoeducational Group Note  Date:  03/29/2013 Time:  11:42 AM  Group Topic/Focus:  Therapeutic Activity  Participation Level:  Active  Participation Quality:  Appropriate  Affect:  Appropriate  Cognitive:  Appropriate  Insight: Appropriate  Engagement in Group:  Engaged  Modes of Intervention:  Discussion    Tyler Lambert 03/29/2013, 11:42 AM
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
## 2013-03-29 NOTE — [facility] Group Notes (Signed)
[facility] LCSW Group Therapy  Feelings Around Relapse 1:15 -2:30        03/29/2013  2:58 PM   Type of Therapy:  Group Therapy  Participation Level:  Appropriate  Participation Quality:  Appropriate  Affect:  Appropriate  Cognitive:  Attentive Appropriate  Insight:  Engaged  Engagement in Therapy:  Engaged  Modes of Intervention:  Discussion Exploration Problem-Solving Supportive  Summary of Progress/Problems:  Patient shared relapse for him would be staying in bed and not spending time with his son.  Patient shared he is excited about his recovery and plans to beat this thing.  Wynn Banker 03/29/2013 2:58 PM

## 2013-03-29 NOTE — [facility] Group Notes (Addendum)
[facility] LCSW Aftercare Discharge Planning Group Note   03/29/2013 10:10 AM  Participation Quality:  Appropriate  Mood/Affect:  Appropriate  Depression Rating:  3-4  Anxiety Rating:  5  Thoughts of Suicide:  No  Will you contract for safety?   NA  Current AVH:  NA  Plan for Discharge/Comments:  Patient attending discharge planning group and actively participated in group.  He will follow up with [facility].  He also shared he plans to follow up with [facility].CSW provided all participants with daily workbook.   Transportation Means: Patient has transportation.  Supports:  Patient has a good support system.   Tyler Lambert, [name]

## 2013-03-30 MED ORDER — LOPERAMIDE HCL 2 MG PO CAPS
2.0000 mg | ORAL_CAPSULE | ORAL | Status: DC | PRN
  Administered 2013-03-30 (×2): 2 mg via ORAL

## 2013-03-30 NOTE — Progress Notes (Signed)
Patient ID: Tyler Lambert, male   DOB: 1975-09-01, 37 y.o.   MRN: [id number]    [facility] MD Progress Note  03/29/2013 9:07 AM Tyler Lambert  MRN:  [id number] Subjective:   Patient states "I have more insight into my depression. Been isolating for year. I plan to get rid of my gun. I'm not having those thoughts now. I feel excited. My friends and family are going to more supportive after I leave. I rate my depression at four." Objective: Patient is observed ambulating around unit with no problem. Is requesting ultram for pain.   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 ADHD, GAD  ADL's:  Intact  Sleep: Poor  Appetite:  Good  Suicidal Ideation:  decreasing Homicidal Ideation:  decreasing AEB (as evidenced by):  Psychiatric Specialty Exam: Review of Systems  Constitutional: Negative.  Negative for fever, chills, weight loss, malaise/fatigue and diaphoresis.  HENT: Negative for congestion and sore throat.   Eyes: Negative for blurred vision, double vision and photophobia.  Respiratory: Negative for cough, shortness of breath and wheezing.   Cardiovascular: Negative for chest pain, palpitations and PND.  Gastrointestinal: Negative for heartburn, nausea, vomiting, abdominal pain, diarrhea and constipation.  Musculoskeletal: Negative for myalgias, joint pain and falls.  Neurological: Negative for dizziness, tingling, tremors, sensory change, speech change, focal weakness, seizures, loss of consciousness, weakness and headaches.  Endo/Heme/Allergies: Negative for polydipsia. Does not bruise/bleed easily.  Psychiatric/Behavioral: Negative for depression, suicidal ideas, hallucinations, memory loss and substance abuse. The patient is not nervous/anxious and does not have insomnia.     Blood pressure 123/84, pulse 98, temperature 97.7 F (36.5 C), temperature source Oral, resp. rate 20, height 5\' 8"  (1.727 m), weight 79.833 kg (176 lb).Body mass index is 26.77 kg/(m^2).  General  Appearance: Fairly Groomed  Patent attorney::  Poor  Speech:  Slow  Volume:  Decreased  Mood:  Anxious and Depressed  Affect:  Constricted  Thought Process:  Goal Directed  Orientation:  Full (Time, Place, and Person)  Thought Content:  WDL  Suicidal Thoughts:  Yes.  without intent/plan  Homicidal Thoughts:  No  Memory:  Immediate;   Fair  Judgement:  Impaired  Insight:  Lacking  Psychomotor Activity:  Decreased  Concentration:  Poor  Recall:  Fair  Akathisia:  No  Handed:  Right  AIMS (if indicated):     Assets:  Communication Skills Desire for Improvement  Sleep:  Number of Hours: 3.75   Current Medications: Current Facility-Administered Medications  Medication Dose Route Frequency Provider Last Rate Last Dose  . acetaminophen (TYLENOL) tablet 650 mg  650 mg Oral Q6H PRN Kerry Hough, PA-C   650 mg at 03/29/13 0756  . alum & mag hydroxide-simeth (MAALOX/MYLANTA) 200-200-20 MG/5ML suspension 30 mL  30 mL Oral Q4H PRN Kerry Hough, PA-C   30 mL at 03/28/13 1945  . amphetamine-dextroamphetamine (ADDERALL XR) 24 hr capsule 20 mg  20 mg Oral Daily Nehemiah Settle, MD   20 mg at 03/29/13 0827  . clonazePAM (KLONOPIN) tablet 1 mg  1 mg Oral BID Kerry Hough, PA-C   1 mg at 03/29/13 0754  . magnesium hydroxide (MILK OF MAGNESIA) suspension 30 mL  30 mL Oral Daily PRN Kerry Hough, PA-C      . nicotine polacrilex (NICORETTE) gum 2 mg  2 mg Oral PRN Nehemiah Settle, MD   2 mg at 03/29/13 0757  . PARoxetine (PAXIL) tablet 40 mg  40 mg Oral Daily  Kerry Hough, PA-C   40 mg at 03/29/13 0755  . QUEtiapine (SEROQUEL) tablet 50 mg  50 mg Oral QHS Kerry Hough, PA-C   50 mg at 03/28/13 2135    Lab Results: No results found for this or any previous visit (from the past 48 hour(s)).  Physical Findings: AIMS: Facial and Oral Movements Muscles of Facial Expression: None, normal Lips and Perioral Area: None, normal Jaw: None, normal Tongue: None,  normal,Extremity Movements Upper (arms, wrists, hands, fingers): None, normal Lower (legs, knees, ankles, toes): None, normal, Trunk Movements Neck, shoulders, hips: None, normal, Overall Severity Severity of abnormal movements (highest score from questions above): None, normal Incapacitation due to abnormal movements: None, normal Patient's awareness of abnormal movements (rate only patient's report): No Awareness, Dental Status Current problems with teeth and/or dentures?: No Does patient usually wear dentures?: No  CIWA:  CIWA-Ar Total: 1 COWS:  COWS Total Score: 2  Treatment Plan Summary: Daily contact with patient to assess and evaluate symptoms and progress in treatment Medication management  Plan: 1. Continue crisis management and stabilization. 2. Medication management to reduce current symptoms to base line and improve patient's overall level of functioning 3. Treat health problems as indicated. 4. Develop treatment plan to decrease risk of relapse upon discharge and the need for     readmission. 5. Psycho-social education regarding relapse prevention and self care. 6. Health care follow up as needed for medical problems. Patient complains of diarrhea from IBS. Order imodium as needed.  7. Continue home medications where appropriate. 8. ELOS: anticipate a Sunday D/C. 9. Added Anaprox for his knee pain, and Protonix for his GERD.  Medical Decision Making Problem Points:  Established problem, stable/improving (1) Data Points:  Review of medication regiment & side effects (2)  I certify that inpatient services furnished can reasonably be expected to improve the patient's condition.  Fransisca Kaufmann NP-C .03/30/2013 3:40 PM

## 2013-03-30 NOTE — Progress Notes (Signed)
Patient ID: Tyler Lambert, male   DOB: October 08, 1975, 37 y.o.   MRN: [id number] D)  Was quiet, soft-spoken, and somewhat anxious this evening, but attended group. Afterward talked with this Clinical research associate about his concern about not being able to keep a job.  Has worked in Holiday [employer] and other areas, wants to get something that he can stay in and keep a paycheck coming.  Made several suggestions, which he seemed interested in.  States he is determined to beat his depression and is going to do what it takes to get control of it.  Able to contract for safety, denies thoughts of self harm at this time.   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

## 2013-03-30 NOTE — [facility] Group Notes (Signed)
[facility] Group Notes:  (Clinical Social Work)  03/30/2013   3:00-4:00PM  Summary of Progress/Problems:   The main focus of today's process group was for the patient to identify ways in which they have sabotaged their own mental health wellness/recovery.  Motivational interviewing was used to explore the reasons they engage in this behavior, and reasons they may have for wanting to change.  The Stages of Change were explained to the group using a handout, and patients identified where they are with regard to changing self-defeating behaviors.  The patient expressed that he isolates from everybody, including being in a dark room with the door closed.  He stated it helps with his anxiety, because he cannot be around people too much.  He stated he wants his life back and his family back, and thus does want to change.  He left prior to the end of group.  Type of Therapy:  Process Group  Participation Level:  Active  Participation Quality:  Attentive  Affect:  Anxious, Depressed and Flat  Cognitive:  Oriented  Insight:  Developing/Improving  Engagement in Therapy:  Developing  Modes of Intervention:  Education, Motivational Interviewing   Ambrose Mantle, LCSW 03/30/2013, 4:57 PM

## 2013-03-30 NOTE — Progress Notes (Signed)
Adult Psychoeducational Group Note  Date:  03/30/2013 Time:  1:15PM  Group Topic/Focus:  Therapeutic Activity  Participation Level:  Active  Participation Quality:  Appropriate  Affect:  Appropriate  Cognitive:  Appropriate  Insight: Improving  Engagement in Group:  Engaged  Modes of Intervention:  Activity  Additional Comments:   Pt was active in the group session and participated in the activity without complication. Pt seemed to enjoy the activity.  Zacarias Pontes R 03/30/2013, 3:18 PM

## 2013-03-30 NOTE — Progress Notes (Signed)
Tyler Lambert remains quiet, sad and withdrawn .  He  Completed  his AM self inventory and on it he wrote  He denied SI wihtin the past 24 hrs, he rated his depression and hopelessness " 4/4" respectively and he stated he wants to " beat this depression".    A He is engaged in his groups, pays attention to the discussion and is seen working on his Saturday Patient Workbook in the grouproom this afternoon.    R Safety is in place and POC cont.

## 2013-03-30 NOTE — Progress Notes (Signed)
Psychoeducational Group Note  Date: 03/30/2013 Time:  1015  Group Topic/Focus:  Identifying Needs:   The focus of this group is to help patients identify their personal needs that have been historically problematic and identify healthy behaviors to address their needs.  Participation Level:  Active  Participation Quality:  Appropriate  Affect:  Appropriate  Cognitive:  Appropriate  Insight:  Improving  Engagement in Group:  Engaged  Additional Comments:    Zarek Relph A  

## 2013-03-30 NOTE — Progress Notes (Signed)
[facility] Group Notes:  (Nursing/MHT/Case Management/Adjunct)  Date:  03/30/2013  Time:  11:13 PM  Type of Therapy:  Psychoeducational Skills  Participation Level:  Did Not Attend  Participation Quality:   Affect:   Cognitive:    Insight:  None  Engagement in Group:  None  Modes of Intervention:  Support  Summary of Progress/Problems: Pt did not attend group this evening.   Jola Baptist 03/30/2013, 11:13 PM

## 2013-03-30 NOTE — Progress Notes (Signed)
The focus of this group is to help patients review their daily goal of treatment and discuss progress on daily workbooks.  During wrap-up group, Selwyn shared that today was good and that he had been in a good mood because he is determined to get past this depression and get his life back. Patient shared that one relapse prevention technique he learned today that can be applied in the future is being open to therapy, and that in the past he just considered it a waste of time. Patient stated that he also plans to tell people to call him out if they see him falling into negative patterns. When asked to name two positives about himself, patient shared that he is loyal and that he is a nice guy.

## 2013-03-31 DIAGNOSIS — F329 Major depressive disorder, single episode, unspecified: Secondary | ICD-10-CM

## 2013-03-31 MED ORDER — AMPHETAMINE-DEXTROAMPHET ER 20 MG PO CP24: 20.0000 mg | ORAL_CAPSULE | Freq: Every day | ORAL | Status: DC

## 2013-03-31 MED ORDER — CLONAZEPAM 1 MG PO TABS
1.0000 mg | ORAL_TABLET | Freq: Two times a day (BID) | ORAL | Status: DC | PRN
Start: 2013-03-31 — End: 2013-06-05

## 2013-03-31 MED ORDER — QUETIAPINE FUMARATE 50 MG PO TABS: 50.0000 mg | ORAL_TABLET | Freq: Every day | ORAL | Status: DC

## 2013-03-31 MED ORDER — NAPROXEN 500 MG PO TABS: 500.0000 mg | ORAL_TABLET | Freq: Two times a day (BID) | ORAL | Status: DC

## 2013-03-31 MED ORDER — PAROXETINE HCL 40 MG PO TABS: 40.0000 mg | ORAL_TABLET | Freq: Every day | ORAL | Status: DC

## 2013-03-31 MED ORDER — PANTOPRAZOLE SODIUM 40 MG PO TBEC: 40.0000 mg | DELAYED_RELEASE_TABLET | Freq: Every day | ORAL | Status: DC

## 2013-03-31 NOTE — Progress Notes (Signed)
Psychoeducational Group Note  Date:  03/31/2013 Time:  1015  Group Topic/Focus:  Making Healthy Choices:   The focus of this group is to help patients identify negative/unhealthy choices they were using prior to admission and identify positive/healthier coping strategies to replace them upon discharge.  Participation Level:  Active  Participation Quality:  Attentive  Affect:  Appropriate  Cognitive:  Appropriate  Insight:  Engaged  Engagement in Group:  Engaged  Additional Comments:    Navina Wohlers A 03/31/2013 

## 2013-03-31 NOTE — Progress Notes (Signed)
Psychoeducational Group Note  Psychoeducational Group Note  Date: 03/31/2013 Time:  03/31/2013  Group Topic/Focus:  Gratefulness:  The focus of this group is to help patients identify what two things they are most grateful for in their lives. What helps ground them and to center them on their work to their recovery.  Participation Level:  Active  Participation Quality:  Appropriate  Affect:  Appropriate  Cognitive:  Appropriate  Insight:  Improving  Engagement in Group:  Engaged  Additional Comments:    Hagen Bohorquez A  
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
## 2013-03-31 NOTE — [facility] Suicide Risk Assessment (Signed)
Suicide Risk Assessment  Discharge Assessment     Demographic Factors:  Male  Mental Status Per Nursing Assessment::   On Admission:  Suicidal ideation indicated by patient;Suicide plan;Self-harm thoughts  Current Mental Status by Physician: No si, no hi, no avh, logical  Loss Factors: Decrease in vocational status  Historical Factors: Impulsivity, past SI gestures  Risk Reduction Factors:   Living with another person, especially a relative  Continued Clinical Symptoms:  Depression:   Impulsivity  Cognitive Features That Contribute To Risk:  Closed-mindedness    Suicide Risk:  Mild:  Suicidal ideation of limited frequency, intensity, duration, and specificity.  There are no identifiable plans, no associated intent, mild dysphoria and related symptoms, good self-control (both objective and subjective assessment), few other risk factors, and identifiable protective factors, including available and accessible social support.  Discharge Diagnoses:   AXIS I:  Depressive Disorder NOS and Depression, Post-Partum AXIS II:  Deferred AXIS III:   Past Medical History  Diagnosis Date  . Anxiety   . Depression   . Acid reflux   . Irritable bowel syndrome   . PTSD (post-traumatic stress disorder)    AXIS IV:  other psychosocial or environmental problems AXIS V:  51-60 moderate symptoms  Plan Of Care/Follow-up recommendations:  Activity:  as tolerated  Is patient on multiple antipsychotic therapies at discharge:  No   Has Patient had three or more failed trials of antipsychotic monotherapy by history:  No  Recommended Plan for Multiple Antipsychotic Therapies: NA  Lan Mcneill 03/31/2013, 11:45 AM

## 2013-03-31 NOTE — Progress Notes (Signed)
Patient ID: Tyler Lambert, male   DOB: 05-04-1976, 37 y.o.   MRN: [id number] D)  Has been quiet, soft spoken, attended group this evening but left after he had participated.  Came to med window, stated he has been having 6-10 diarrhea stools/day, was requesting imodium, given.  Was also given a pitcher of gatorade, fluids encouraged.  States has a lot of cramping, bloated, miserable.  Feels this is one of his main sources of depression, has also been part of the reason for having issues with his job.  Hoping he can find a medication to help get his IBS under control. A)  Encourage fluids, support, encourage f/u with gastroenterologist.  Will continue to monitor for safety, continue POC R)  Appreciative, receptive, safety maintained.

## 2013-03-31 NOTE — Progress Notes (Signed)
Nrsg DC MD completed DC order and DC SRA in chart. Pt denies audit, vis, tactile halluc. Pt given DC AVS and stated he understood and will comply. Pt given prescriptions and  sample meds and all belongings returned to pt per protocol. Pt escorted to bldg entrance to DC.

## 2013-04-03 IMAGING — CR DG LUMBAR SPINE COMPLETE 4+V
5 series · 5 of 5 positions shown · non-contrast
Comparison: Abdominal film 05/03/2008

CLINICAL DATA: Fall

LUMBAR SPINE - COMPLETE 4+ VIEW

[view not recorded (1 of 5)]
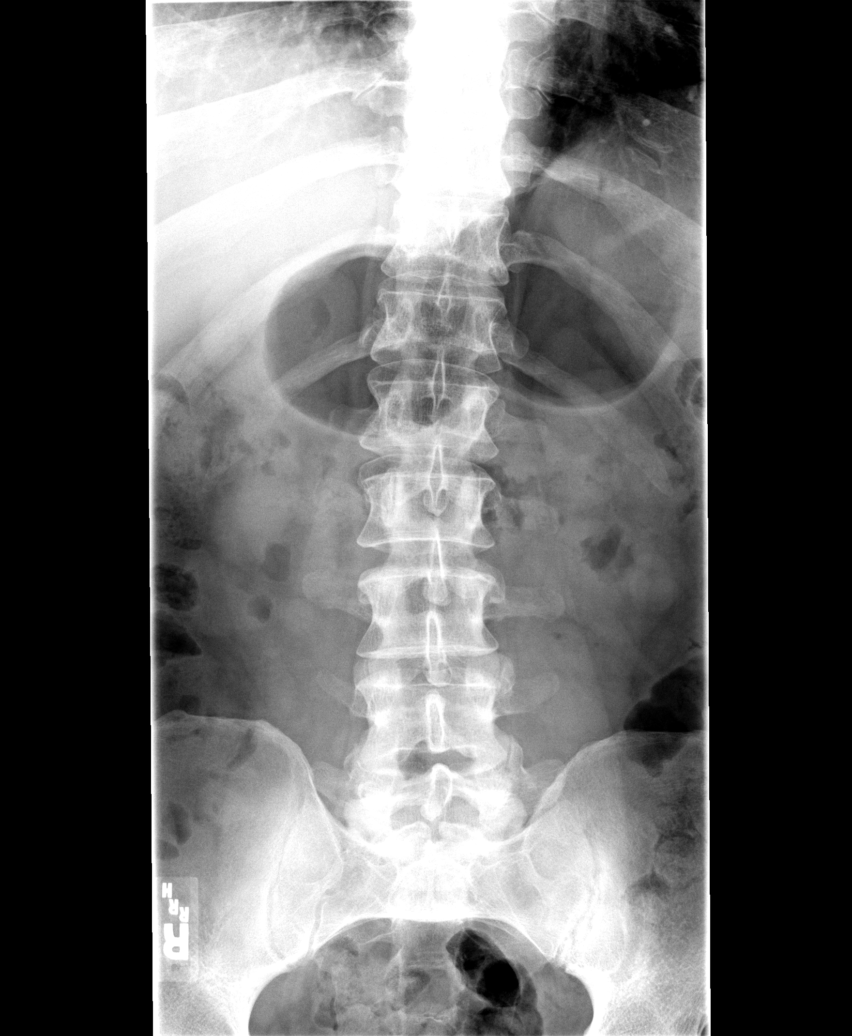

[view not recorded (2 of 5)]
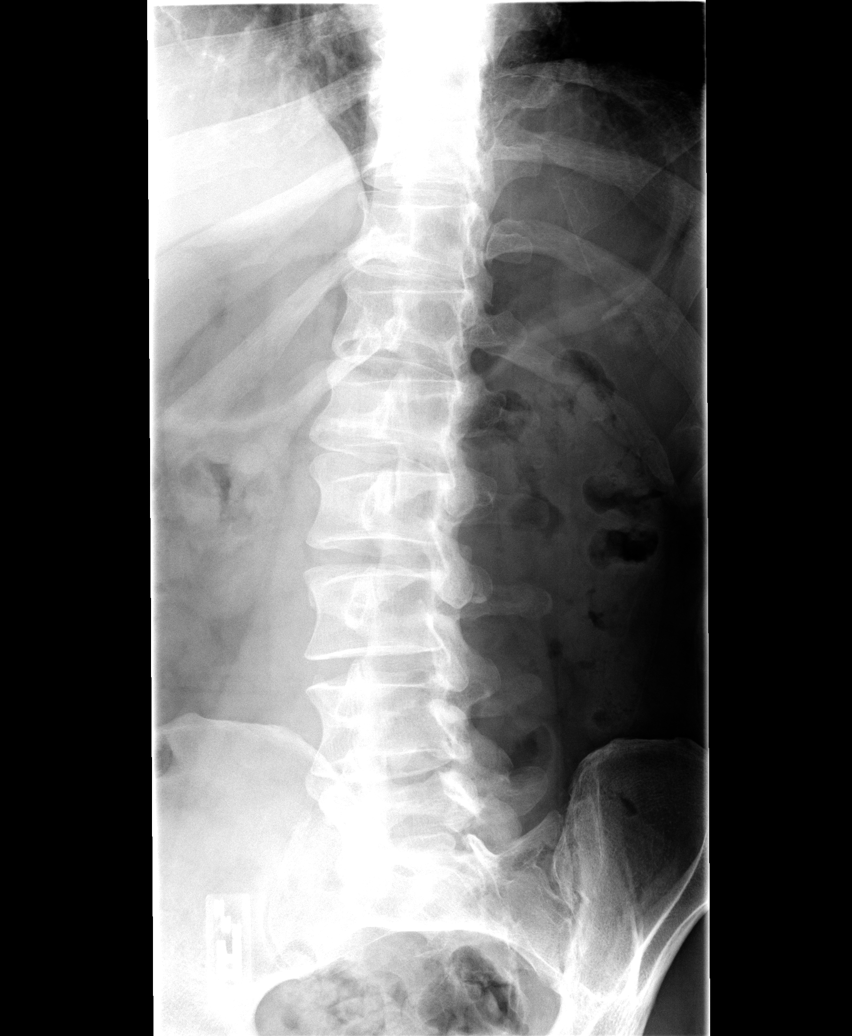

[view not recorded (3 of 5)]
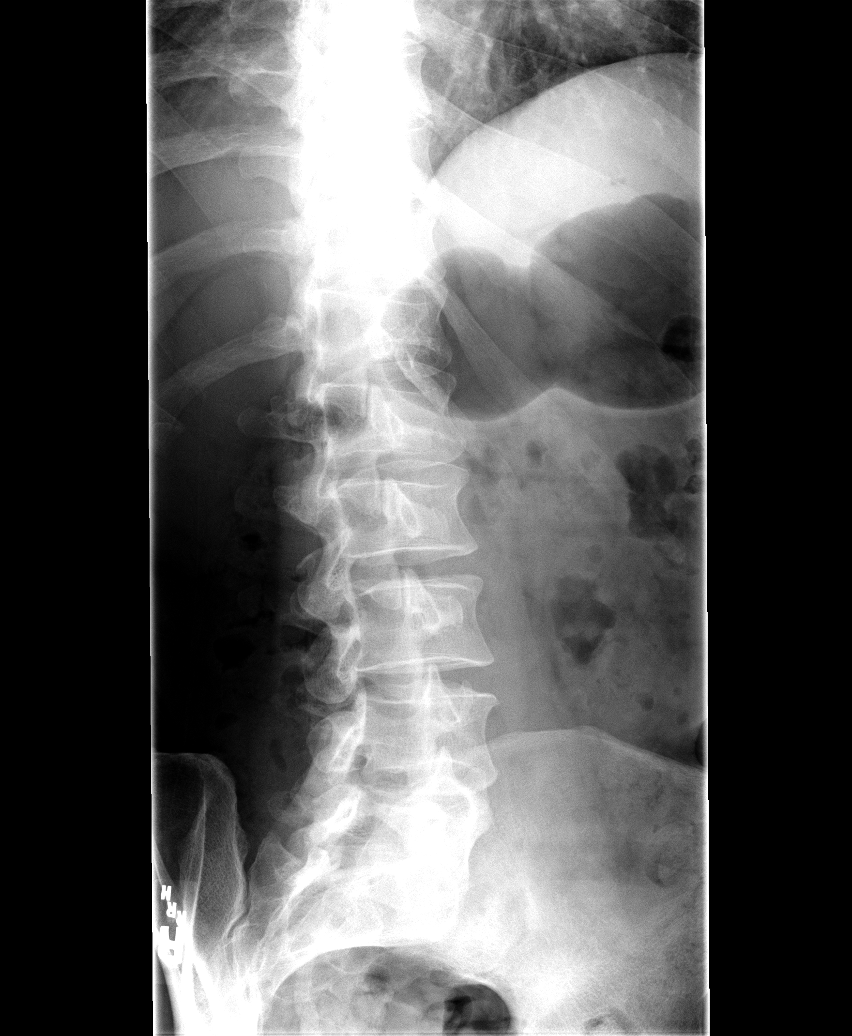

[view not recorded (4 of 5)]
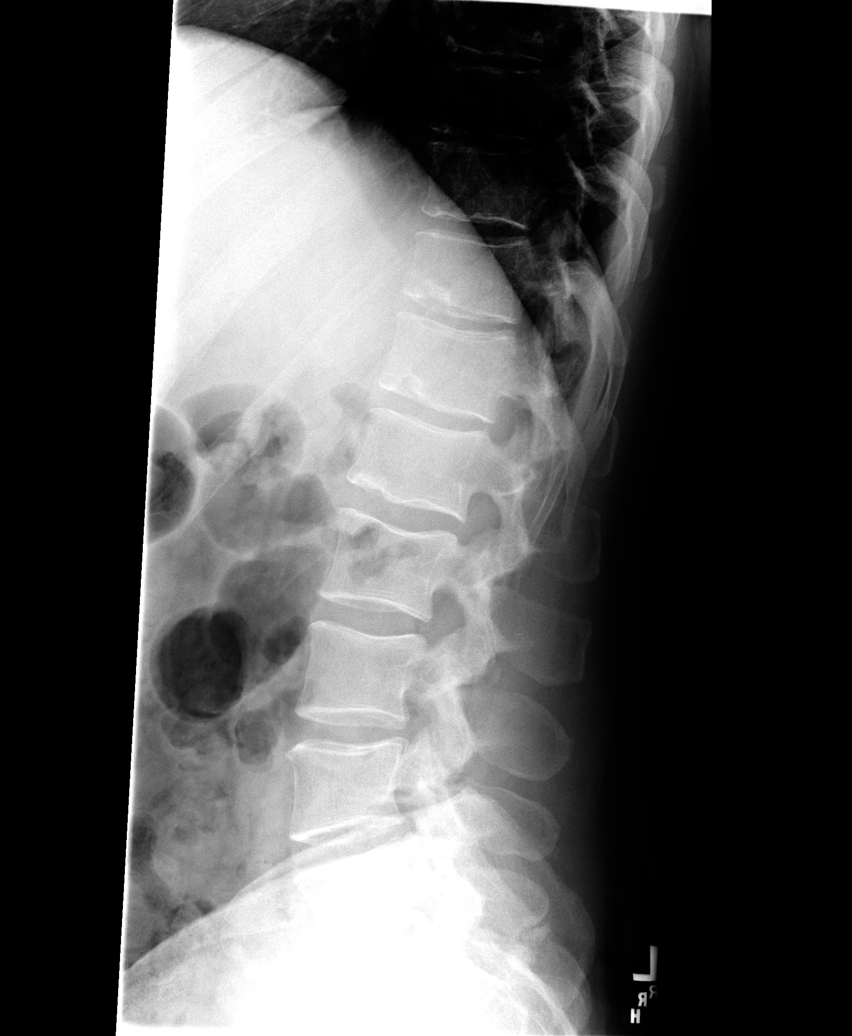

[view not recorded (5 of 5)]
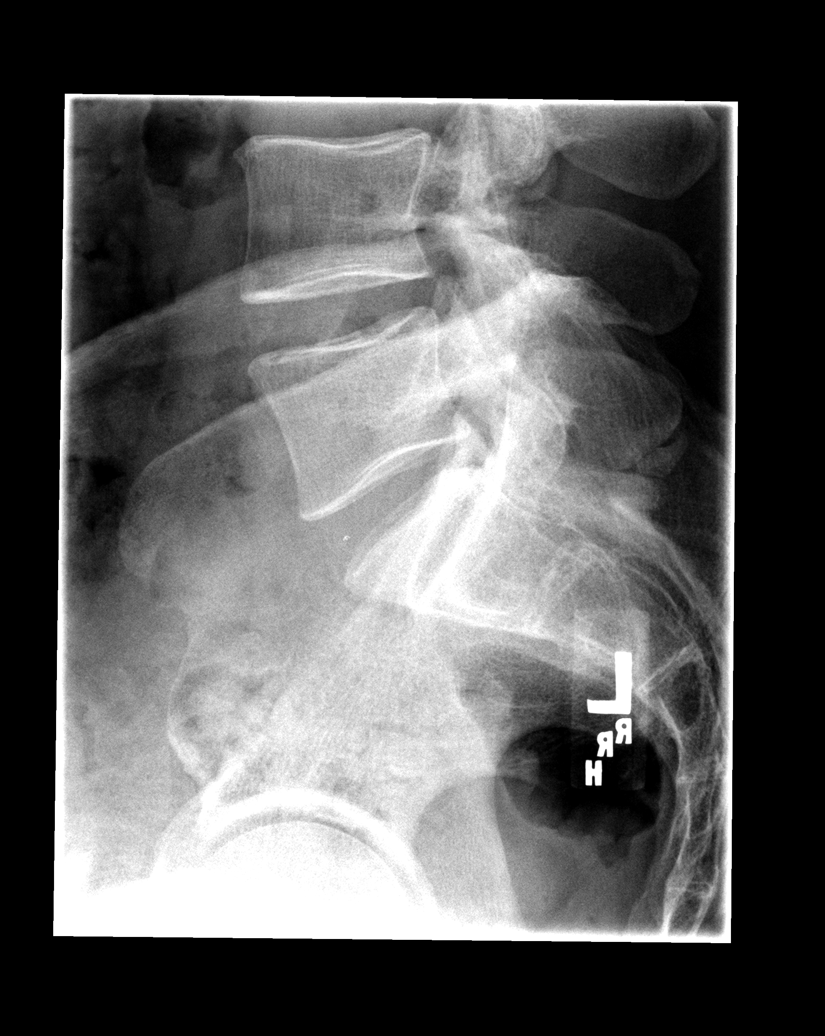

[5 of 5 positions shown; findings below may reference images not displayed]

FINDINGS: Vertebral body alignment, heights and disc space heights
are within normal.  There is mild spondylosis present.  There are a
few small Schmorl's nodes.  There is no acute fracture or
subluxation.
IMPRESSION: Mild spondylosis.  No acute findings.

## 2013-04-03 IMAGING — CR DG CERVICAL SPINE COMPLETE 4+V
6 series · 6 of 6 positions shown · non-contrast
Comparison: None.

CLINICAL DATA: Posterior neck pain, fell off ladder on [REDACTED]

CERVICAL SPINE - COMPLETE 4+ VIEW

[view not recorded (1 of 6)]
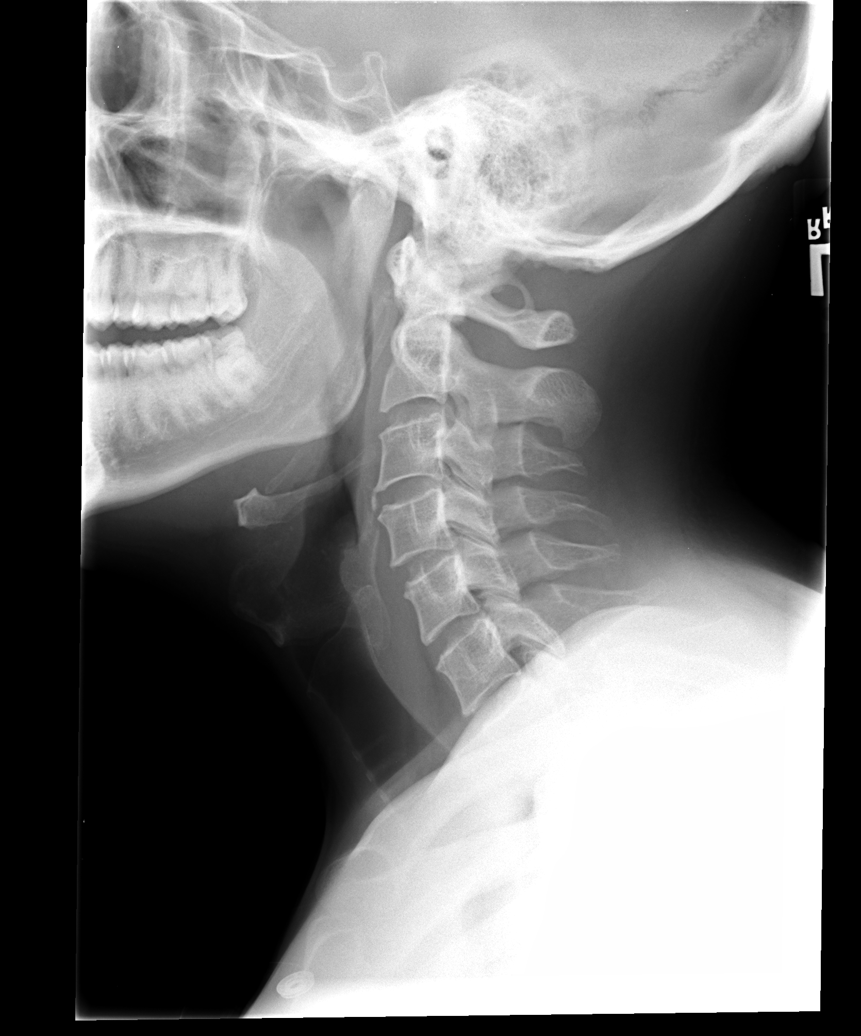

[view not recorded (2 of 6)]
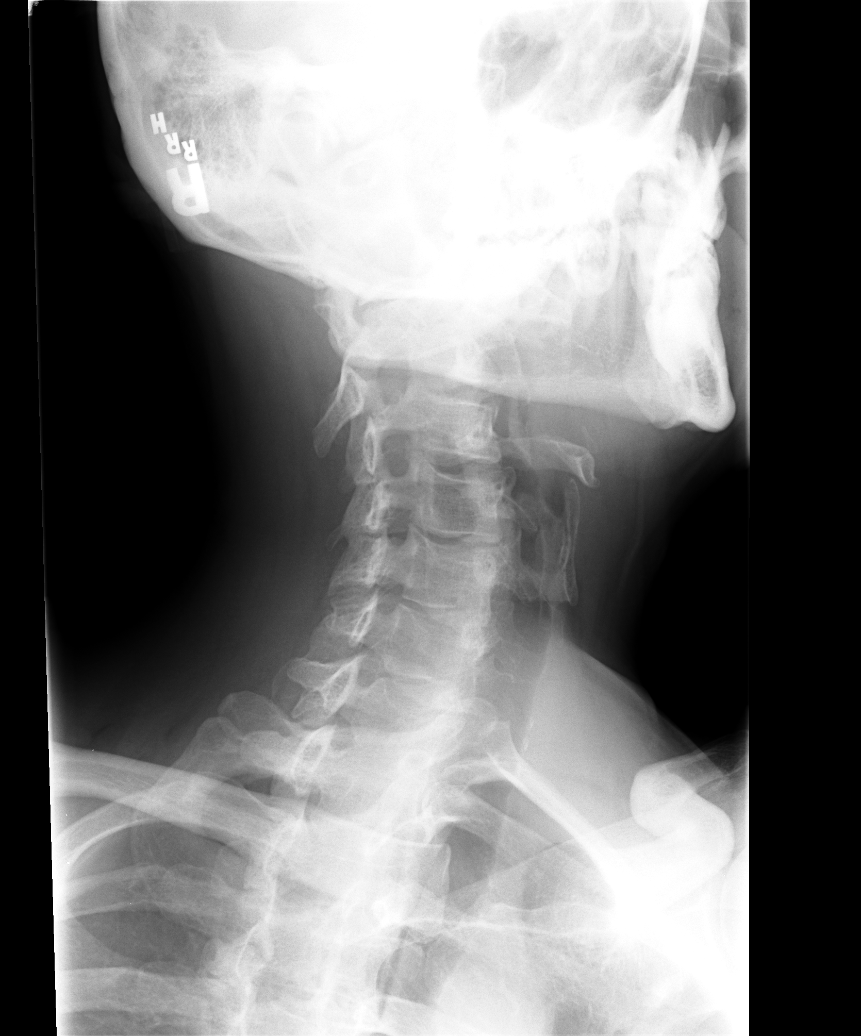

[view not recorded (3 of 6)]
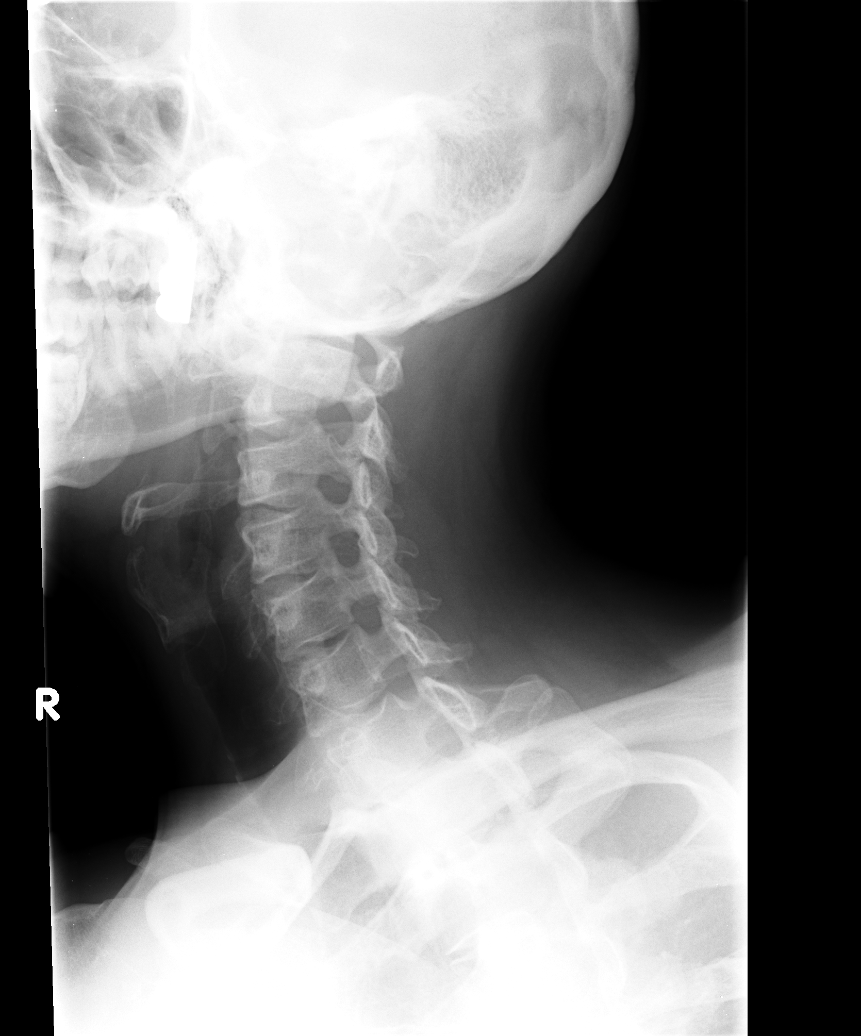

[view not recorded (4 of 6)]
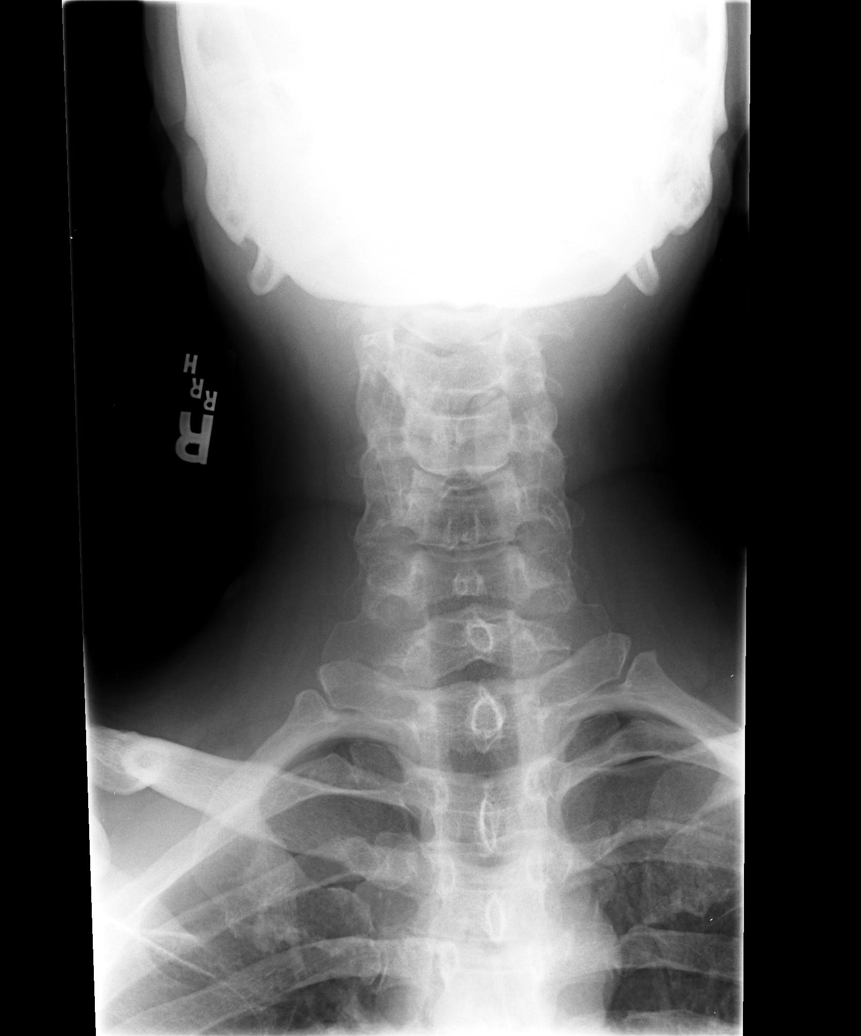

[view not recorded (5 of 6)]
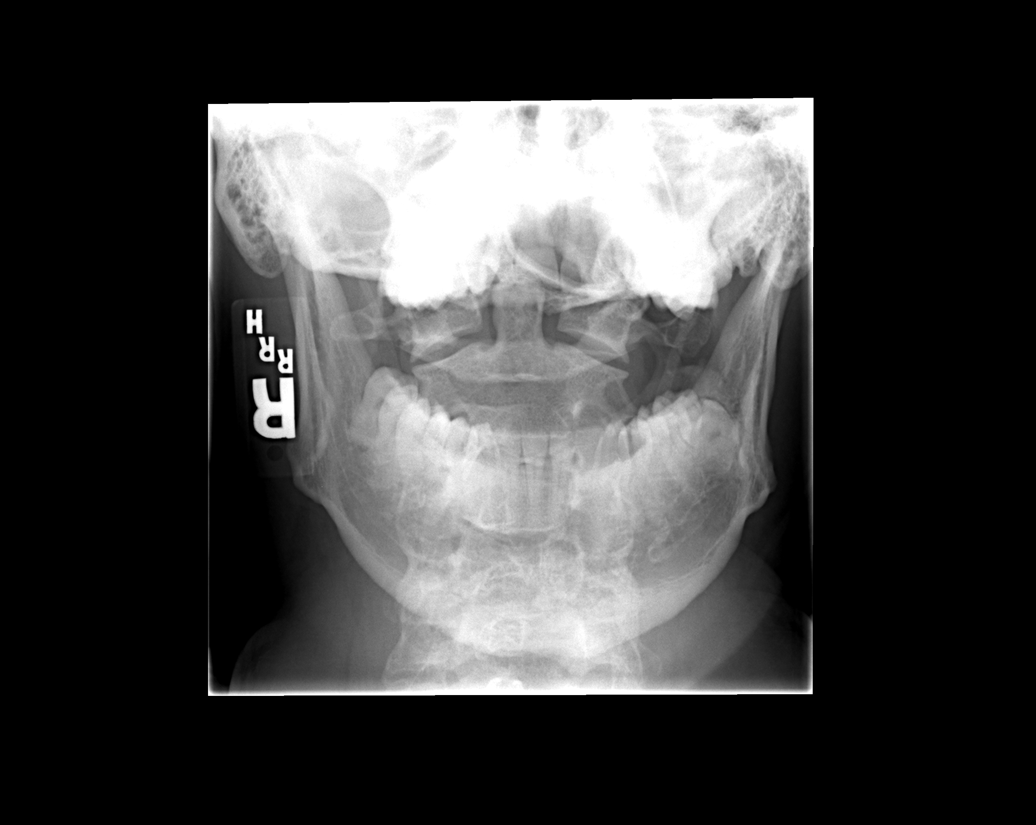

[view not recorded (6 of 6)]
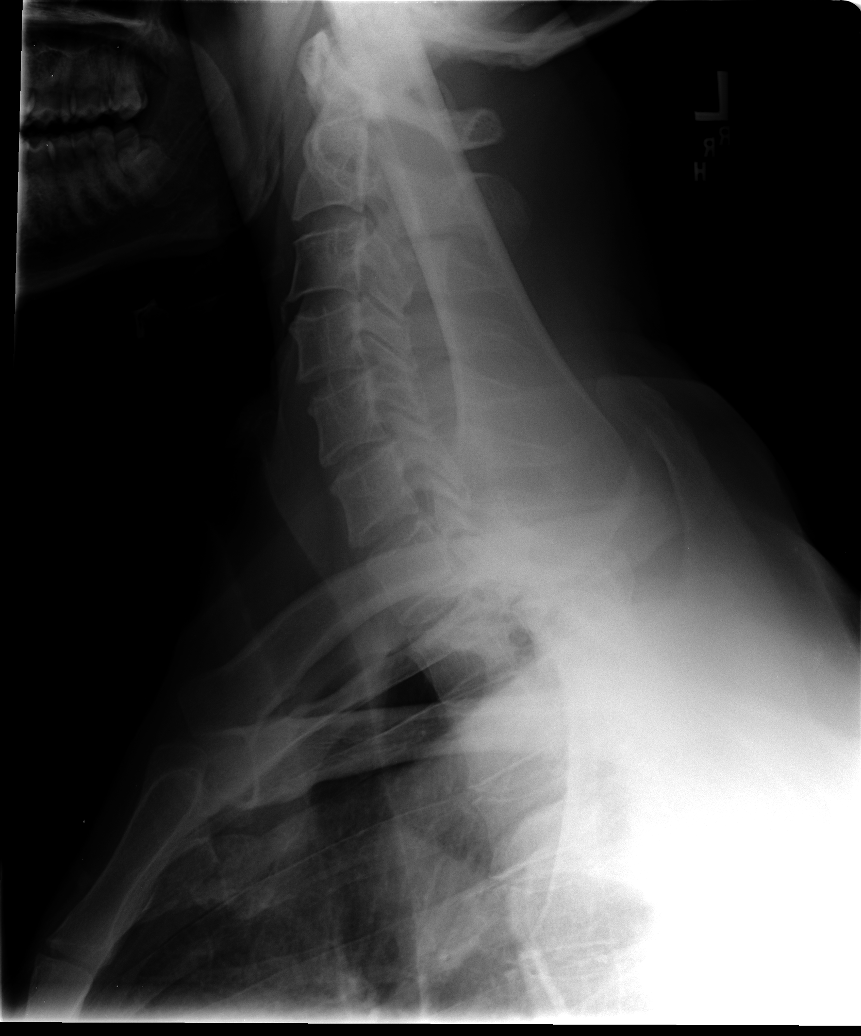

[6 of 6 positions shown; findings below may reference images not displayed]

FINDINGS: The cervical vertebrae are in normal alignment.
Intervertebral disc spaces appear normal.  No prevertebral soft
tissue swelling is seen.  No cervical fracture is noted.  On
oblique views the foramina are patent.  The odontoid process is
intact.  The lung apices appear clear with degenerative change of
the right first costochondral junction..
IMPRESSION: Normal alignment.  Normal disc spaces

## 2013-04-03 NOTE — Progress Notes (Signed)
Patient Discharge Instructions:  After Visit Summary (AVS):   Faxed to:  04/03/13 Discharge Summary Note:   Faxed to:  04/03/13 Psychiatric Admission Assessment Note:   Faxed to:  04/03/13 Suicide Risk Assessment - Discharge Assessment:   Faxed to:  04/03/13 Faxed/Sent to the Next Level Care provider:  04/03/13 Faxed to [facility] @ [phone] Faxed to [facility] @ [phone]  Jerelene Redden, 04/03/2013, 3:38 PM

## 2013-05-31 ENCOUNTER — Encounter (HOSPITAL_BASED_OUTPATIENT_CLINIC_OR_DEPARTMENT_OTHER): Payer: Self-pay | Admitting: Emergency Medicine

## 2013-05-31 ENCOUNTER — Telehealth (HOSPITAL_COMMUNITY): Payer: Self-pay | Admitting: Licensed Clinical Social Worker

## 2013-05-31 ENCOUNTER — Emergency Department (HOSPITAL_BASED_OUTPATIENT_CLINIC_OR_DEPARTMENT_OTHER)
Admission: EM | Admit: 2013-05-31 | Discharge: 2013-05-31 | Disposition: A | Discharge status: Home / Self Care | Payer: Federal, State, Local not specified - Other | Attending: Emergency Medicine | Admitting: Emergency Medicine

## 2013-05-31 DIAGNOSIS — Z79899 Other long term (current) drug therapy: Secondary | ICD-10-CM | POA: Insufficient documentation

## 2013-05-31 DIAGNOSIS — F3289 Other specified depressive episodes: Secondary | ICD-10-CM | POA: Insufficient documentation

## 2013-05-31 DIAGNOSIS — Z760 Encounter for issue of repeat prescription: Secondary | ICD-10-CM | POA: Insufficient documentation

## 2013-05-31 DIAGNOSIS — F329 Major depressive disorder, single episode, unspecified: Secondary | ICD-10-CM | POA: Insufficient documentation

## 2013-05-31 DIAGNOSIS — Z791 Long term (current) use of non-steroidal anti-inflammatories (NSAID): Secondary | ICD-10-CM | POA: Insufficient documentation

## 2013-05-31 DIAGNOSIS — F419 Anxiety disorder, unspecified: Secondary | ICD-10-CM

## 2013-05-31 DIAGNOSIS — K219 Gastro-esophageal reflux disease without esophagitis: Secondary | ICD-10-CM | POA: Insufficient documentation

## 2013-05-31 DIAGNOSIS — F4310 Post-traumatic stress disorder, unspecified: Secondary | ICD-10-CM | POA: Insufficient documentation

## 2013-05-31 DIAGNOSIS — F411 Generalized anxiety disorder: Secondary | ICD-10-CM | POA: Insufficient documentation

## 2013-05-31 MED ORDER — AMPHETAMINE-DEXTROAMPHET ER 20 MG PO CP24: 20.0000 mg | ORAL_CAPSULE | Freq: Every day | ORAL | Status: DC

## 2013-05-31 MED ORDER — TRAMADOL HCL 50 MG PO TABS
100.0000 mg | ORAL_TABLET | Freq: Once | ORAL | Status: AC
  Administered 2013-05-31: 100 mg via ORAL
  Filled 2013-05-31: qty 2

## 2013-05-31 MED ORDER — TRAMADOL HCL 50 MG PO TABS: 50.0000 mg | ORAL_TABLET | Freq: Four times a day (QID) | ORAL | Status: DC | PRN

## 2013-05-31 NOTE — ED Notes (Signed)
Patient is resting comfortably. 

## 2013-05-31 NOTE — ED Provider Notes (Signed)
CSN: [id number]     Arrival date & time 05/31/13  1028 History   First MD Initiated Contact with Patient 05/31/13 1208     Chief Complaint  Patient presents with  . Anxiety  . Medication Refill   (Consider location/radiation/quality/duration/timing/severity/associated sxs/prior Treatment) Patient is a 37 y.o. male presenting with anxiety. No language interpreter was used.  Anxiety This is a chronic problem. The current episode started more than 1 year ago. The problem occurs constantly. The problem has been gradually worsening. Nothing aggravates the symptoms. He has tried nothing for the symptoms.  Pt complains of being out of adderall and not being able to focus and being anxious.   Pt reports he is also out of torodol  Past Medical History  Diagnosis Date  . Anxiety   . Depression   . Acid reflux   . Irritable bowel syndrome   . PTSD (post-traumatic stress disorder)    Past Surgical History  Procedure Laterality Date  . Upper gastrointestinal endoscopy     Family History  Problem Relation Age of Onset  . Heart failure Father    History  Substance Use Topics  . Smoking status: Never Smoker   . Smokeless tobacco: Current User    Types: Snuff  . Alcohol Use: No    Review of Systems  All other systems reviewed and are negative.    Allergies  Divalproex sodium  Home Medications   Current Outpatient Rx  Name  Route  Sig  Dispense  Refill  . amphetamine-dextroamphetamine (ADDERALL XR) 20 MG 24 hr capsule   Oral   Take 1 capsule (20 mg total) by mouth daily. For attention   5 capsule   0   . clonazePAM (KLONOPIN) 1 MG tablet   Oral   Take 1 tablet (1 mg total) by mouth 2 (two) times daily as needed for anxiety.   30 tablet   0   . naproxen (NAPROSYN) 500 MG tablet   Oral   Take 1 tablet (500 mg total) by mouth 2 (two) times daily with a meal. For pain   60 tablet   0   . pantoprazole (PROTONIX) 40 MG tablet   Oral   Take 1 tablet (40 mg total) by  mouth daily. For acid reflux   30 tablet   0   . PARoxetine (PAXIL) 40 MG tablet   Oral   Take 1 tablet (40 mg total) by mouth daily.   30 tablet   0   . QUEtiapine (SEROQUEL) 50 MG tablet   Oral   Take 1 tablet (50 mg total) by mouth at bedtime.   30 tablet   0   . traMADol (ULTRAM) 50 MG tablet   Oral   Take 1 tablet (50 mg total) by mouth every 6 (six) hours as needed for pain.   20 tablet   0    BP 139/80  Pulse 120  Temp(Src) 97.5 F (36.4 C) (Oral)  Resp 20  Ht 5\' 8"  (1.727 m)  Wt 178 lb (80.74 kg)  BMI 27.07 kg/m2  SpO2 98% Physical Exam  Nursing note and vitals reviewed. Constitutional: He is oriented to person, place, and time. He appears well-developed and well-nourished.  HENT:  Head: Normocephalic and atraumatic.  Eyes: Conjunctivae are normal. Pupils are equal, round, and reactive to light.  Cardiovascular: Normal rate.   Pulmonary/Chest: Effort normal.  Musculoskeletal: Normal range of motion.  Neurological: He is alert and oriented to person, place, and time. He  has normal reflexes.  Skin: Skin is warm.  Psychiatric: He has a normal mood and affect.    ED Course  Procedures (including critical care time) Labs Review Labs Reviewed - No data to display Imaging Review No results found.  EKG Interpretation   None       MDM   1. Anxiety   I discussed with Dr. Rubin Payor,    I spoke with [facility].   Pt scheduled for appointment with Psychiatrist on Tuesday.   I will give pt ultram and adderall for 5 days until he can be seen  I explained we will not manage his medications, that we will do this once but he must see psychiatrist on tuesday  Elson Areas, PA-C 05/31/13 1323  Lonia Skinner [place], PA-C 05/31/13 1325

## 2013-05-31 NOTE — BH Assessment (Signed)
Received a call from Trisha Mangle at [facility]. Requesting assistance in finding a follow up appointment for this patient. The following appointment was made for this patient.  [facility] [address] [place], [place] [phone]  **Appointment Information: [name] your appointment with [facility] is scheduled for Tuesday June 04, 2013 at 10:00am. Please contact the provider if you have any additional questions and/or concerns.

## 2013-05-31 NOTE — ED Notes (Signed)
Pt states that his doctor will not refill his adderall  And ever since not having it he has been having anxiety and inability to focus.

## 2013-06-02 NOTE — ED Provider Notes (Signed)
Medical screening examination/treatment/procedure(s) were performed by non-physician practitioner and as supervising physician I was immediately available for consultation/collaboration.  Rodric Punch R. Charyl Minervini, MD 06/02/13 0651 

## 2013-06-05 ENCOUNTER — Encounter (HOSPITAL_COMMUNITY): Payer: Self-pay | Admitting: Emergency Medicine

## 2013-06-05 ENCOUNTER — Emergency Department (HOSPITAL_COMMUNITY)
Admission: EM | Admit: 2013-06-05 | Discharge: 2013-06-05 | Disposition: A | Discharge status: Home / Self Care | Payer: Medicaid Other | Attending: Emergency Medicine | Admitting: Emergency Medicine

## 2013-06-05 DIAGNOSIS — F4310 Post-traumatic stress disorder, unspecified: Secondary | ICD-10-CM | POA: Insufficient documentation

## 2013-06-05 DIAGNOSIS — F329 Major depressive disorder, single episode, unspecified: Secondary | ICD-10-CM | POA: Insufficient documentation

## 2013-06-05 DIAGNOSIS — F3289 Other specified depressive episodes: Secondary | ICD-10-CM | POA: Insufficient documentation

## 2013-06-05 DIAGNOSIS — F411 Generalized anxiety disorder: Secondary | ICD-10-CM | POA: Insufficient documentation

## 2013-06-05 DIAGNOSIS — Z760 Encounter for issue of repeat prescription: Secondary | ICD-10-CM

## 2013-06-05 DIAGNOSIS — Z79899 Other long term (current) drug therapy: Secondary | ICD-10-CM | POA: Insufficient documentation

## 2013-06-05 DIAGNOSIS — K219 Gastro-esophageal reflux disease without esophagitis: Secondary | ICD-10-CM | POA: Insufficient documentation

## 2013-06-05 MED ORDER — AMPHETAMINE-DEXTROAMPHET ER 20 MG PO CP24: 20.0000 mg | ORAL_CAPSULE | ORAL | Status: DC

## 2013-06-05 MED ORDER — CLONAZEPAM 1 MG PO TABS: 1.0000 mg | ORAL_TABLET | Freq: Two times a day (BID) | ORAL | Status: DC | PRN

## 2013-06-05 MED ORDER — TRAMADOL HCL 50 MG PO TABS: 50.0000 mg | ORAL_TABLET | Freq: Four times a day (QID) | ORAL | Status: DC | PRN

## 2013-06-05 NOTE — ED Provider Notes (Signed)
CSN: [id number]     Arrival date & time 06/05/13  1304 History   None    Chief Complaint  Patient presents with  . Medication Refill   (Consider location/radiation/quality/duration/timing/severity/associated sxs/prior Treatment) HPI Patient is here for refills of medications.  He states he went to his appointment yesterday, but they stated they were not able to help him with the sense of medications.  Patient does not have any complaints at this time Past Medical History  Diagnosis Date  . Anxiety   . Depression   . Acid reflux   . Irritable bowel syndrome   . PTSD (post-traumatic stress disorder)    Past Surgical History  Procedure Laterality Date  . Upper gastrointestinal endoscopy     Family History  Problem Relation Age of Onset  . Heart failure Father    History  Substance Use Topics  . Smoking status: Never Smoker   . Smokeless tobacco: Current User    Types: Snuff  . Alcohol Use: No    Review of Systems All other systems negative except as documented in the HPI. All pertinent positives and negatives as reviewed in the HPI. Allergies  Divalproex sodium  Home Medications   Current Outpatient Rx  Name  Route  Sig  Dispense  Refill  . amphetamine-dextroamphetamine (ADDERALL XR) 20 MG 24 hr capsule   Oral   Take 20 mg by mouth every morning.         . clonazePAM (KLONOPIN) 1 MG tablet   Oral   Take 1 mg by mouth 2 (two) times daily as needed for anxiety.         . diphenhydramine-acetaminophen (TYLENOL PM) 25-500 MG TABS   Oral   Take 2 tablets by mouth at bedtime.         Marland Kitchen omeprazole (PRILOSEC) 20 MG capsule   Oral   Take 20 mg by mouth daily.         Marland Kitchen PARoxetine (PAXIL) 40 MG tablet   Oral   Take 60 mg by mouth every morning.         . traMADol (ULTRAM) 50 MG tablet   Oral   Take 50 mg by mouth every 6 (six) hours as needed for pain.          BP 130/73  Pulse 76  Temp(Src) 98.3 F (36.8 C) (Oral)  Resp 17  SpO2 98% Physical  Exam  Nursing note and vitals reviewed. Constitutional: He is oriented to person, place, and time. He appears well-developed and well-nourished. No distress.  HENT:  Head: Normocephalic and atraumatic.  Eyes: Pupils are equal, round, and reactive to light.  Pulmonary/Chest: Effort normal.  Neurological: He is alert and oriented to person, place, and time.  Skin: Skin is warm and dry.    ED Course  Procedures (including critical care time) I explained to the patient will give him a limited quantity with referrals for further care.  Told to return here as needed.  Advised patient that this is not something we can manage long-term MDM     Carlyle Dolly, PA-C 06/05/13 1501

## 2013-06-05 NOTE — ED Provider Notes (Signed)
Medical screening examination/treatment/procedure(s) were performed by non-physician practitioner and as supervising physician I was immediately available for consultation/collaboration.  EKG Interpretation   None         Coraleigh Sheeran N Chistian Kasler, DO 06/05/13 1726 

## 2013-06-05 NOTE — ED Notes (Signed)
Pt here for medication refill, Klonopin, Tramadol, Adderall. States that he was at [facility] yesterday for refill wasn't able to do it. States that he doesn't want to go into withdrawals.

## 2013-06-05 NOTE — ED Notes (Signed)
Pt here for med refill of klonopin, adderall, and tramadol.  No complaints.

## 2013-07-06 ENCOUNTER — Inpatient Hospital Stay (HOSPITAL_COMMUNITY)
Admission: AD | Admit: 2013-07-06 | Discharge: 2013-07-11 | DRG: 885 | Disposition: A | Discharge status: Home / Self Care | Payer: Medicaid Other | Source: Intra-hospital | Attending: Psychiatry | Admitting: Psychiatry

## 2013-07-06 ENCOUNTER — Encounter (HOSPITAL_COMMUNITY): Payer: Self-pay | Admitting: *Deleted

## 2013-07-06 ENCOUNTER — Emergency Department (EMERGENCY_DEPARTMENT_HOSPITAL)
Admission: EM | Admit: 2013-07-06 | Discharge: 2013-07-06 | Disposition: A | Discharge status: Other Acute Inpatient Hospital | Payer: Medicaid Other | Source: Home / Self Care | Attending: Emergency Medicine | Admitting: Emergency Medicine

## 2013-07-06 ENCOUNTER — Encounter (HOSPITAL_COMMUNITY): Payer: Self-pay | Admitting: Emergency Medicine

## 2013-07-06 DIAGNOSIS — F329 Major depressive disorder, single episode, unspecified: Secondary | ICD-10-CM

## 2013-07-06 DIAGNOSIS — F321 Major depressive disorder, single episode, moderate: Principal | ICD-10-CM | POA: Diagnosis present

## 2013-07-06 DIAGNOSIS — K219 Gastro-esophageal reflux disease without esophagitis: Secondary | ICD-10-CM | POA: Insufficient documentation

## 2013-07-06 DIAGNOSIS — S61509A Unspecified open wound of unspecified wrist, initial encounter: Secondary | ICD-10-CM | POA: Insufficient documentation

## 2013-07-06 DIAGNOSIS — X789XXA Intentional self-harm by unspecified sharp object, initial encounter: Secondary | ICD-10-CM | POA: Insufficient documentation

## 2013-07-06 DIAGNOSIS — F4310 Post-traumatic stress disorder, unspecified: Secondary | ICD-10-CM | POA: Insufficient documentation

## 2013-07-06 DIAGNOSIS — F909 Attention-deficit hyperactivity disorder, unspecified type: Secondary | ICD-10-CM | POA: Diagnosis present

## 2013-07-06 DIAGNOSIS — K589 Irritable bowel syndrome without diarrhea: Secondary | ICD-10-CM | POA: Diagnosis present

## 2013-07-06 DIAGNOSIS — Z79899 Other long term (current) drug therapy: Secondary | ICD-10-CM | POA: Insufficient documentation

## 2013-07-06 DIAGNOSIS — S61519A Laceration without foreign body of unspecified wrist, initial encounter: Secondary | ICD-10-CM

## 2013-07-06 DIAGNOSIS — F411 Generalized anxiety disorder: Secondary | ICD-10-CM | POA: Insufficient documentation

## 2013-07-06 DIAGNOSIS — F419 Anxiety disorder, unspecified: Secondary | ICD-10-CM

## 2013-07-06 DIAGNOSIS — G4700 Insomnia, unspecified: Secondary | ICD-10-CM | POA: Insufficient documentation

## 2013-07-06 DIAGNOSIS — F4320 Adjustment disorder, unspecified: Secondary | ICD-10-CM | POA: Diagnosis present

## 2013-07-06 DIAGNOSIS — R51 Headache: Secondary | ICD-10-CM | POA: Insufficient documentation

## 2013-07-06 DIAGNOSIS — Z7289 Other problems related to lifestyle: Secondary | ICD-10-CM

## 2013-07-06 DIAGNOSIS — R45851 Suicidal ideations: Secondary | ICD-10-CM

## 2013-07-06 DIAGNOSIS — F319 Bipolar disorder, unspecified: Secondary | ICD-10-CM | POA: Insufficient documentation

## 2013-07-06 HISTORY — DX: Attention-deficit hyperactivity disorder, unspecified type: F90.9

## 2013-07-06 HISTORY — DX: Bipolar disorder, unspecified: F31.9

## 2013-07-06 HISTORY — DX: Insomnia, unspecified: G47.00

## 2013-07-06 LAB — TYPE AND SCREEN
ABO/RH(D): A POS
Antibody Screen: NEGATIVE

## 2013-07-06 LAB — RAPID URINE DRUG SCREEN, HOSP PERFORMED
Amphetamines: NOT DETECTED
Cocaine: NOT DETECTED
Opiates: NOT DETECTED

## 2013-07-06 LAB — COMPREHENSIVE METABOLIC PANEL
ALT: 44 U/L (ref 0–53)
AST: 30 U/L (ref 0–37)
Albumin: 3.8 g/dL (ref 3.5–5.2)
CO2: 19 mEq/L (ref 19–32)
Calcium: 9 mg/dL (ref 8.4–10.5)
Creatinine, Ser: 0.92 mg/dL (ref 0.50–1.35)
GFR calc non Af Amer: >90 mL/min (ref >90)
Sodium: 135 mEq/L (ref 135–145)
Total Protein: 7.1 g/dL (ref 6.0–8.3)

## 2013-07-06 LAB — PROTIME-INR
INR: 0.96 (ref 0.00–1.49)
Prothrombin Time: 12.6 seconds (ref 11.6–15.2)

## 2013-07-06 LAB — CBC
MCH: 29.8 pg (ref 26.0–34.0)
MCHC: 35 g/dL (ref 30.0–36.0)
MCV: 85 fL (ref 78.0–100.0)
Platelets: 214 10*3/uL (ref 150–400)
RDW: 13.9 % (ref 11.5–15.5)

## 2013-07-06 LAB — SALICYLATE LEVEL: Salicylate Lvl: <2 mg/dL — ABNORMAL LOW (ref 2.8–20.0)

## 2013-07-06 LAB — ETHANOL: Alcohol, Ethyl (B): <11 mg/dL (ref 0–11)

## 2013-07-06 MED ORDER — KETOROLAC TROMETHAMINE 30 MG/ML IJ SOLN
30.0000 mg | Freq: Once | INTRAMUSCULAR | Status: AC
  Administered 2013-07-06: 30 mg via INTRAVENOUS

## 2013-07-06 MED ORDER — ALUM & MAG HYDROXIDE-SIMETH 200-200-20 MG/5ML PO SUSP
30.0000 mL | ORAL | Status: DC | PRN
  Administered 2013-07-08: 30 mL via ORAL

## 2013-07-06 MED ORDER — MAGNESIUM HYDROXIDE 400 MG/5ML PO SUSP: 30.0000 mL | Freq: Every day | ORAL | Status: DC | PRN

## 2013-07-06 MED ORDER — SODIUM CHLORIDE 0.9 % IV SOLN
INTRAVENOUS | Status: DC
  Administered 2013-07-06: 14:00:00 via INTRAVENOUS

## 2013-07-06 MED ORDER — ACETAMINOPHEN 325 MG PO TABS: 650.0000 mg | ORAL_TABLET | ORAL | Status: DC | PRN

## 2013-07-06 MED ORDER — IBUPROFEN 600 MG PO TABS
600.0000 mg | ORAL_TABLET | Freq: Three times a day (TID) | ORAL | Status: DC | PRN
  Administered 2013-07-06 – 2013-07-07 (×2): 600 mg via ORAL
  Filled 2013-07-06 (×2): qty 1

## 2013-07-06 MED ORDER — INFLUENZA VAC SPLIT QUAD 0.5 ML IM SUSP
0.5000 mL | INTRAMUSCULAR | Status: AC
  Administered 2013-07-07: 0.5 mL via INTRAMUSCULAR
  Filled 2013-07-06: qty 0.5

## 2013-07-06 MED ORDER — FLUOXETINE HCL 20 MG PO CAPS
20.0000 mg | ORAL_CAPSULE | Freq: Every day | ORAL | Status: DC
  Administered 2013-07-06: 20 mg via ORAL
  Filled 2013-07-06: qty 1

## 2013-07-06 MED ORDER — ARIPIPRAZOLE 10 MG PO TABS
10.0000 mg | ORAL_TABLET | Freq: Every day | ORAL | Status: DC
  Administered 2013-07-06: 10 mg via ORAL
  Filled 2013-07-06: qty 1

## 2013-07-06 MED ORDER — IBUPROFEN 200 MG PO TABS: 600.0000 mg | ORAL_TABLET | Freq: Three times a day (TID) | ORAL | Status: DC | PRN

## 2013-07-06 MED ORDER — ACETAMINOPHEN 325 MG PO TABS: 650.0000 mg | ORAL_TABLET | Freq: Four times a day (QID) | ORAL | Status: DC | PRN

## 2013-07-06 MED ORDER — TRAZODONE HCL 50 MG PO TABS
50.0000 mg | ORAL_TABLET | Freq: Every evening | ORAL | Status: DC | PRN
  Administered 2013-07-06 – 2013-07-10 (×5): 50 mg via ORAL
  Filled 2013-07-06: qty 1
  Filled 2013-07-06: qty 14
  Filled 2013-07-06 (×4): qty 1

## 2013-07-06 MED ORDER — LIDOCAINE-EPINEPHRINE 2 %-1:100000 IJ SOLN
INTRAMUSCULAR | Status: AC
  Administered 2013-07-06: 14:00:00
  Filled 2013-07-06: qty 1

## 2013-07-06 MED ORDER — ALUM & MAG HYDROXIDE-SIMETH 200-200-20 MG/5ML PO SUSP: 30.0000 mL | ORAL | Status: DC | PRN

## 2013-07-06 MED ORDER — HYDROXYZINE HCL 50 MG PO TABS
50.0000 mg | ORAL_TABLET | Freq: Every evening | ORAL | Status: DC | PRN
  Administered 2013-07-08 – 2013-07-09 (×2): 50 mg via ORAL
  Filled 2013-07-06 (×2): qty 1

## 2013-07-06 MED ORDER — KETOROLAC TROMETHAMINE 30 MG/ML IJ SOLN
INTRAMUSCULAR | Status: AC
  Filled 2013-07-06: qty 1

## 2013-07-06 MED ORDER — LORAZEPAM 1 MG PO TABS: 1.0000 mg | ORAL_TABLET | Freq: Three times a day (TID) | ORAL | Status: DC | PRN

## 2013-07-06 MED ORDER — HYDROCODONE-ACETAMINOPHEN 5-325 MG PO TABS
2.0000 | ORAL_TABLET | Freq: Once | ORAL | Status: AC
  Administered 2013-07-06: 2 via ORAL
  Filled 2013-07-06: qty 2

## 2013-07-06 NOTE — ED Notes (Signed)
Psychiatry at bedside.

## 2013-07-06 NOTE — Consult Note (Signed)
[facility] Face-to-Face Psychiatry Consult   Reason for Consult:  Suicide attempt Referring Physician:  EDP Tyler Lambert is an 37 y.o. male.  Assessment: AXIS I:  Anxiety Disorder NOS, Major Depression, Recurrent severe and Suicide attempt AXIS II:  Personality Disorder NOS AXIS III:   Past Medical History  Diagnosis Date  . Anxiety   . Depression   . Acid reflux   . Irritable bowel syndrome   . PTSD (post-traumatic stress disorder)   . Bipolar 1 disorder   . ADHD (attention deficit hyperactivity disorder)   . Insomnia    AXIS IV:  housing problems, other psychosocial or environmental problems, problems related to legal system/crime and problems related to social environment AXIS V:  41-50 serious symptoms  Plan:  Recommend psychiatric Inpatient admission when medically cleared.  Subjective:   Tyler Lambert is a 37 y.o. male patient admitted with Major Depressive d/o, Recurrent, severe, Suicide attempt.  HPI:   Caucasian male brought in to our ER for attempted suicide by cutting his bilateral wrist.  Patient sates he cut his wrist as a suicide to kill himself and he is still endorsing suicide during this interview.   It is of note that patient was admitted to our inpatient psychiatric unit in August for attempted suicide by cutting his wrist.  Patient states he has been on antidepressant and is compliant with his medications.  Patient states he was caught pawnning  stolen goods and he feels sad.  Patient states he disappointed his 59 year old son and his family.  He states he better be dead than stay alive and his son will be better with another father.  Patient states" I feel guilty and stupid for what I have done to my son"    He reports poor sleep and that he sleeps only 4 hours at night.  He reports "too good an appetite"  And that he gained some weight.  He rates his depression 10/10 despite taking his medications.  He sees Quinn Axe for his Psychiatric medications at [facility]  clinic in [place].  Before today patient was living with his 42 year old son at his brother's home.  He denies HI/AVH.   We have accepted patient for admission in our inpatient Psychiatric unit and we will resume all of his home medications.  HPI Elements:   Location:  [facility]. Quality:  SEVERE TO THE EXTENT OF CUTTING HIS BILATERAL WRIST. Severity:  SEVERE. Context:  SELLING STOLEN GOODS.  Past Psychiatric History: Past Medical History  Diagnosis Date  . Anxiety   . Depression   . Acid reflux   . Irritable bowel syndrome   . PTSD (post-traumatic stress disorder)   . Bipolar 1 disorder   . ADHD (attention deficit hyperactivity disorder)   . Insomnia     reports that he has never smoked. His smokeless tobacco use includes Snuff. He reports that he does not drink alcohol or use illicit drugs. Family History  Problem Relation Age of Onset  . Heart failure Father            Allergies:   Allergies  Allergen Reactions  . Divalproex Sodium Other (See Comments)    Patient unsure of reaction    ACT Assessment Complete: NO   Educational Status: GED    Risk to Self: Risk to self Is patient at risk for suicide?: Yes Substance abuse history and/or treatment for substance abuse?: No  Risk to Others:  NO  Abuse:  DENIES  Prior Inpatient Therapy:  YES-[facility]  Prior Outpatient Therapy:  YES  Additional Information:                    Objective: Blood pressure 113/70, pulse 88, temperature 97.7 F (36.5 C), temperature source Oral, resp. rate 12, SpO2 97.00%.There is no weight on file to calculate BMI. Results for orders placed during the hospital encounter of 07/06/13 (from the past 72 hour(s))  PROTIME-INR     Status: None   Collection Time    07/06/13  1:30 PM      Result Value Range   Prothrombin Time 12.6  11.6 - 15.2 seconds   INR 0.96  0.00 - 1.49  TYPE AND SCREEN     Status: None   Collection Time    07/06/13  1:30 PM      Result Value Range    ABO/RH(D) A POS     Antibody Screen NEG     Sample Expiration 07/09/2013     Labs are reviewed and are pertinent for .  Current Facility-Administered Medications  Medication Dose Route Frequency Provider Last Rate Last Dose  . 0.9 %  sodium chloride infusion   Intravenous Continuous Gilda Crease, MD 150 mL/hr at 07/06/13 1341     Current Outpatient Prescriptions  Medication Sig Dispense Refill  . amphetamine-dextroamphetamine (ADDERALL XR) 20 MG 24 hr capsule Take 1 capsule (20 mg total) by mouth every morning.  5 capsule  0  . clonazePAM (KLONOPIN) 1 MG tablet Take 1 tablet (1 mg total) by mouth 2 (two) times daily as needed for anxiety.  14 tablet  0  . diphenhydramine-acetaminophen (TYLENOL PM) 25-500 MG TABS Take 2 tablets by mouth at bedtime.      Marland Kitchen omeprazole (PRILOSEC) 20 MG capsule Take 20 mg by mouth daily.      Marland Kitchen PARoxetine (PAXIL) 40 MG tablet Take 60 mg by mouth every morning.      . traMADol (ULTRAM) 50 MG tablet Take 1 tablet (50 mg total) by mouth every 6 (six) hours as needed for pain.  20 tablet  0    Psychiatric Specialty Exam:     Blood pressure 113/70, pulse 88, temperature 97.7 F (36.5 C), temperature source Oral, resp. rate 12, SpO2 97.00%.There is no weight on file to calculate BMI.  General Appearance: Casual  Eye Contact::  None  Speech:  Clear and Coherent and Normal Rate  Volume:  Normal  Mood:  Angry, Anxious, Depressed and Hopeless  Affect:  Congruent, Depressed, Flat and Tearful  Thought Process:  Coherent, Goal Directed and Intact  Orientation:  Full (Time, Place, and Person)  Thought Content:  NA  Suicidal Thoughts:  Yes.  without intent/plan  Homicidal Thoughts:  No  Memory:  Immediate;   Good Recent;   Good Remote;   Good  Judgement:  Poor  Insight:  Lacking and Shallow  Psychomotor Activity:  Normal  Concentration:  Fair  Recall:  NA  Akathisia:  NA  Handed:  Right  AIMS (if indicated):     Assets:  Desire for Improvement   Sleep:      Treatment Plan Summary:  Consult with Dr Lolly Mustache We have accepted patient for admission in our inpatient Psychiatric unit We will resume his medications We will transfer as soon as bed is available. Daily contact with patient to assess and evaluate symptoms and progress in treatment Medication management  Earney Navy   PMHNP-BC 07/06/2013 3:38 PM  I agreed  with the findings, treatment and disposition plan of this patient. Kathryne SharperSyed Maryagnes Carrasco, MD

## 2013-07-06 NOTE — ED Provider Notes (Signed)
Act team indicates pt accepted to [facility], Dr Lolly Mustache. Pt calm and alert. Nad. Vitals normal. Appears stable for transfer.     Suzi Roots, MD 07/06/13 [phone]

## 2013-07-06 NOTE — Progress Notes (Signed)
Psychoeducational Group Note  Date:  07/06/2013 Time:  8:00 p.m.   Group Topic/Focus:  Wrap-Up Group:   The focus of this group is to help patients review their daily goal of treatment and discuss progress on daily workbooks.  Participation Level: Did Not Attend  Participation Quality:  Not Applicable  Affect:  Not Applicable  Cognitive:  Not Applicable  Insight:  Not Applicable  Engagement in Group: Not Applicable  Additional Comments:  The patient didn't attend group this evening since he was in the process of being admitted while the group was being held.   Hazle Coca S 07/06/2013, 10:25 PM

## 2013-07-06 NOTE — Tx Team (Signed)
Initial Interdisciplinary Treatment Plan  PATIENT STRENGTHS: (choose at least two) Ability for insight Average or above average intelligence Capable of independent living  PATIENT STRESSORS: Legal issue   PROBLEM LIST: Problem List/Patient Goals Date to be addressed Date deferred Reason deferred Estimated date of resolution  Suicidal Ideation 07/06/13                                                      DISCHARGE CRITERIA:  Ability to meet basic life and health needs Improved stabilization in mood, thinking, and/or behavior Verbal commitment to aftercare and medication compliance  PRELIMINARY DISCHARGE PLAN: Attend aftercare/continuing care group Return to previous living arrangement  PATIENT/FAMIILY INVOLVEMENT: This treatment plan has been presented to and reviewed with the patient, Tyler Lambert, and/or family member, .  The patient and family have been given the opportunity to ask questions and make suggestions.  Tyler Lambert, [name] 07/06/2013, 8:35 PM

## 2013-07-06 NOTE — ED Notes (Signed)
Brought dinner tray to pt's room, pt asleep at this time.

## 2013-07-06 NOTE — ED Notes (Signed)
MD Pollina at bedside suturing right wrist

## 2013-07-06 NOTE — ED Provider Notes (Signed)
CSN: [id number]     Arrival date & time 07/06/13  1314 History   First MD Initiated Contact with Patient 07/06/13 1317     Chief Complaint  Patient presents with  . Medical Clearance   (Consider location/radiation/quality/duration/timing/severity/associated sxs/prior Treatment) HPI Comments: Emergency Department after cutting his wrists. Patient was reportedly in custody of police when this occurred. Patient reports that he feels like he is in failure, his account his family and wants to die. He is still suicidal on arrival. Patient complaining of headache. There was not any head injury. Is complaining of mild pain in the wrists. He has some tingling of the right wrist, states "I think it's because it's wrapped tight". He has not noticed any inability to move the hands or fingers.   Past Medical History  Diagnosis Date  . Anxiety   . Depression   . Acid reflux   . Irritable bowel syndrome   . PTSD (post-traumatic stress disorder)   . Bipolar 1 disorder   . ADHD (attention deficit hyperactivity disorder)   . Insomnia    Past Surgical History  Procedure Laterality Date  . Upper gastrointestinal endoscopy     Family History  Problem Relation Age of Onset  . Heart failure Father    History  Substance Use Topics  . Smoking status: Never Smoker   . Smokeless tobacco: Current User    Types: Snuff  . Alcohol Use: No    Review of Systems  Skin: Positive for wound.  Psychiatric/Behavioral: Positive for suicidal ideas and dysphoric mood.  All other systems reviewed and are negative.    Allergies  Divalproex sodium  Home Medications   Current Outpatient Rx  Name  Route  Sig  Dispense  Refill  . amphetamine-dextroamphetamine (ADDERALL XR) 20 MG 24 hr capsule   Oral   Take 1 capsule (20 mg total) by mouth every morning.   5 capsule   0   . clonazePAM (KLONOPIN) 1 MG tablet   Oral   Take 1 tablet (1 mg total) by mouth 2 (two) times daily as needed for anxiety.   14  tablet   0   . diphenhydramine-acetaminophen (TYLENOL PM) 25-500 MG TABS   Oral   Take 2 tablets by mouth at bedtime.         Marland Kitchen omeprazole (PRILOSEC) 20 MG capsule   Oral   Take 20 mg by mouth daily.         Marland Kitchen PARoxetine (PAXIL) 40 MG tablet   Oral   Take 60 mg by mouth every morning.         . traMADol (ULTRAM) 50 MG tablet   Oral   Take 1 tablet (50 mg total) by mouth every 6 (six) hours as needed for pain.   20 tablet   0    BP 113/70  Pulse 88  Temp(Src) 97.7 F (36.5 C) (Oral)  Resp 12  SpO2 97% Physical Exam  Constitutional: He is oriented to person, place, and time. He appears well-developed and well-nourished. No distress.  HENT:  Head: Normocephalic and atraumatic.  Right Ear: Hearing normal.  Left Ear: Hearing normal.  Nose: Nose normal.  Mouth/Throat: Oropharynx is clear and moist and mucous membranes are normal.  Eyes: Conjunctivae and EOM are normal. Pupils are equal, round, and reactive to light.  Neck: Normal range of motion. Neck supple.  Cardiovascular: Regular rhythm, S1 normal and S2 normal.  Exam reveals no gallop and no friction rub.   No murmur  heard. Pulmonary/Chest: Effort normal and breath sounds normal. No respiratory distress. He exhibits no tenderness.  Abdominal: Soft. Normal appearance and bowel sounds are normal. There is no hepatosplenomegaly. There is no tenderness. There is no rebound, no guarding, no tenderness at McBurney's point and negative Murphy's sign. No hernia.  Musculoskeletal: Normal range of motion.  Neurological: He is alert and oriented to person, place, and time. He has normal strength. No cranial nerve deficit or sensory deficit. Coordination normal. GCS eye subscore is 4. GCS verbal subscore is 5. GCS motor subscore is 6.  Skin: Skin is warm, dry and intact. No rash noted. No cyanosis.     Psychiatric: His speech is normal and behavior is normal. He exhibits a depressed mood. He expresses suicidal ideation.     ED Course  Procedures (including critical care time)  LACERATION REPAIR Performed by: Gilda Crease. Authorized by: Gilda Crease Consent: Verbal consent obtained. Risks and benefits: risks, benefits and alternatives were discussed Consent given by: patient Patient identity confirmed: provided demographic data Prepped and Draped in normal sterile fashion Wound explored  Laceration Location: left wrist Laceration Length: 6cm No Foreign Bodies seen or palpated Anesthesia: local infiltration Local anesthetic: lidocaine 1% with epinephrine Anesthetic total: 4 ml Irrigation method: syringe Amount of cleaning: copius - 1 liter Skin closure: sutures, 3-0 Prolene Number of sutures: 8 Technique: simple interrupted Narrative: Was extensively irrigated and explored. Lacerations are to the dermis but not through the subcutaneous tissues. I was able to evaluate the wounds with moving his hands and fingers, no evidence of any tendon involvement. Radius and ulna arteries are palpable and not within the wound. No pulsatile blood flow. No concern for vascular injury. This has normal sensation and normal motor function across all III nerves in the hand.   Patient tolerance: Patient tolerated the procedure well with no immediate complications.  LACERATION REPAIR Performed by: Gilda Crease. Authorized by: Gilda Crease Consent: Verbal consent obtained. Risks and benefits: risks, benefits and alternatives were discussed Consent given by: patient Patient identity confirmed: provided demographic data Prepped and Draped in normal sterile fashion Wound explored  Laceration Location: right wrist Laceration Length: 6cm No Foreign Bodies seen or palpated Anesthesia: local infiltration Local anesthetic: lidocaine 1% with epinephrine Anesthetic total: 4 ml Irrigation method: syringe Amount of cleaning: copius - 1 liter Skin closure: sutures, 3-0  Prolene Number of sutures: 8 Technique: simple interrupted Narrative: Was extensively irrigated and explored. Lacerations are to the dermis but not through the subcutaneous tissues. I was able to evaluate the wounds with moving his hands and fingers, no evidence of any tendon involvement. Radius and ulna arteries are palpable and not within the wound. No pulsatile blood flow. No concern for vascular injury. This has normal sensation and normal motor function across all III nerves in the hand.   Patient tolerance: Patient tolerated the procedure well with no immediate complications.   Labs Review Labs Reviewed  PROTIME-INR  TYPE AND SCREEN  ABO/RH   Imaging Review No results found.  EKG Interpretation   None       MDM  Diagnosis: 1. Bilateral wrist laceration 2. Suicide attempt 3. Depression  Patient presents to the ER with lacerations of both wrists. This was a suicide attempt. Patient is still actively suicidal. Extensive evaluation of the wrist reveals no tendon injury, no neurologic deficit, no vascular injury. Sutures were placed. Patient evaluated by psychiatry, will require inpatient psychiatric treatment. Patient medically cleared for treatment.  Gilda Crease, MD 07/06/13 [phone]

## 2013-07-06 NOTE — BH Assessment (Signed)
Contacted by Dahlia Byes NP regarding accepting patient to [facility].  Patient under arrest in [facility] at time of injury. Patient cannot return to [facility] jail at this time because [place] jail does not have the resources/ capacity to place patient on "suicide watch." Clint Bolder, Chiropodist telephoned [employer] [phone]) who confirms that patient cannot be placed on suicide precautions while at their facility. Police Chief or staff to follow up with patient's social work case Production designer, theatre/television/film on Monday 07/08/2013.  Patient will be IVC at this time and is not to be discharged until contact made with [facility]. MD Denton Lank made aware and agress with this plan.   Patient accepted by MD Arfeen to [facility]. Attending MD Jonnalagadda, assigned bed #503-2.

## 2013-07-06 NOTE — Progress Notes (Signed)
37 year old male pt admitted on involuntary basis. Pt brought in after cutting wrists while in police custody. Pt endorses SI on admission but is able to contract for safety on the unit. Pt spoke about living with mom and brother in [place]. Pt also reports that he has been doing his best in taking his medications and going to his follow-up appointments. Pt was oriented to the unit and safety maintained.

## 2013-07-06 NOTE — ED Notes (Signed)
[facility] brought pt in, states pt was under arrest, [employer] had pt in handcuffs, pt got a hold of letterman on belt and cut bilateral wrists. [place] police should be on route to ED. bp 120/82, pulse 80

## 2013-07-06 NOTE — Progress Notes (Signed)
Patient ID: Tyler Lambert, male   DOB: 1975-11-30, 37 y.o.   MRN: [id number]  Patient received sutures in the ED to repair bilateral wrist lacerations. The patient's nurse noticed that patient had bled through his dressing. Upon assessment, patient was found to have a moderate amount of red drainage to the dressing to his right wrist. The dressing to his left wrist was clean, dry and intact. Patient has good circulation and sensation to hands and fingers. Capillary refill < 2 secs. Dressing to right wrist was removed. Patient has 8 sutures intact, no active bleeding at this time. Wound was cleaned with saline, antibiotic ointment applied, covered with 4 x 4 and secured with bulky Kerlix dressing. Dressing to left wrist removed. Patient has 9 sutures intact. Wound was cleaned with saline. Patient had an area at base of the palm that was oozing blood. One 1/4" steri-strip applied, antibiotic ointment applied, covered with 4 x 4 and secured with bulky Kerlix dressing. Patient instructed not to manipulate dressings and to notify staff if he noticed bleeding coming through his dressings; patient verbalized understanding.

## 2013-07-06 NOTE — ED Notes (Signed)
Assisted pt to stand up to use urinal - Laceration to right wrist began bleeding again. Pt holding pressure to wrist. [employer] at bedside

## 2013-07-06 NOTE — ED Notes (Signed)
Pt presents with lacerations to bilateral wrists. Wrists wrapped in gauze to control bleeding. Pt cooperative and calm

## 2013-07-06 NOTE — ED Notes (Signed)
Pt accepted to [facility], waiting for bed. [employer] at bedside

## 2013-07-06 NOTE — BH Assessment (Addendum)
[facility] Assessment Progress Note   Patient has been IVC'ed by Dr. Denton Lank.  IVC papers were faxed to magistrate at 17:24.  Inetta Fermo [facility]) gave room assignment of [phone].  Nursing staff notified of status of IVC papers and room assignment.  Awaiting IVC papers to be served.   It should be noted that patient is to be returned to the custody of [facility] upon discharge.  They are to be the ones to pick him up, not family or anyone else.  Below are numbers to call: [facility] [phone] Chief [place] [phone] Det. [employer] [phone] Det. Micki Riley [phone] Lt. Katrinka Blazing [phone] Lt. Cleopatra Cedar [phone]

## 2013-07-07 DIAGNOSIS — F321 Major depressive disorder, single episode, moderate: Principal | ICD-10-CM

## 2013-07-07 DIAGNOSIS — F4320 Adjustment disorder, unspecified: Secondary | ICD-10-CM

## 2013-07-07 DIAGNOSIS — F39 Unspecified mood [affective] disorder: Secondary | ICD-10-CM

## 2013-07-07 DIAGNOSIS — F4310 Post-traumatic stress disorder, unspecified: Secondary | ICD-10-CM

## 2013-07-07 MED ORDER — PAROXETINE HCL 20 MG PO TABS
40.0000 mg | ORAL_TABLET | Freq: Every day | ORAL | Status: DC
  Administered 2013-07-07 – 2013-07-10 (×4): 40 mg via ORAL
  Filled 2013-07-07: qty 28
  Filled 2013-07-07 (×4): qty 2
  Filled 2013-07-07: qty 4
  Filled 2013-07-07: qty 2

## 2013-07-07 MED ORDER — TRAMADOL HCL 50 MG PO TABS
50.0000 mg | ORAL_TABLET | Freq: Three times a day (TID) | ORAL | Status: DC
  Filled 2013-07-07: qty 1

## 2013-07-07 MED ORDER — NICOTINE POLACRILEX 2 MG MT GUM
CHEWING_GUM | OROMUCOSAL | Status: AC
  Filled 2013-07-07: qty 1

## 2013-07-07 MED ORDER — NICOTINE 21 MG/24HR TD PT24: 21.0000 mg | MEDICATED_PATCH | Freq: Every day | TRANSDERMAL | Status: DC

## 2013-07-07 MED ORDER — NICOTINE POLACRILEX 2 MG MT GUM
2.0000 mg | CHEWING_GUM | OROMUCOSAL | Status: DC | PRN
  Administered 2013-07-07 – 2013-07-11 (×12): 2 mg via ORAL
  Filled 2013-07-07 (×11): qty 1

## 2013-07-07 MED ORDER — CLONAZEPAM 0.5 MG PO TABS
0.5000 mg | ORAL_TABLET | Freq: Two times a day (BID) | ORAL | Status: DC
  Administered 2013-07-07 – 2013-07-11 (×8): 0.5 mg via ORAL
  Filled 2013-07-07 (×8): qty 1

## 2013-07-07 MED ORDER — TRAMADOL HCL 50 MG PO TABS
50.0000 mg | ORAL_TABLET | Freq: Three times a day (TID) | ORAL | Status: DC | PRN
  Administered 2013-07-07 – 2013-07-10 (×7): 50 mg via ORAL
  Filled 2013-07-07 (×6): qty 1

## 2013-07-07 MED ORDER — CELECOXIB 200 MG PO CAPS
200.0000 mg | ORAL_CAPSULE | Freq: Every day | ORAL | Status: DC
  Administered 2013-07-07 – 2013-07-08 (×2): 200 mg via ORAL
  Filled 2013-07-07 (×4): qty 1

## 2013-07-07 MED ORDER — NICOTINE POLACRILEX 2 MG MT GUM
CHEWING_GUM | OROMUCOSAL | Status: AC
  Administered 2013-07-07: 14:00:00
  Filled 2013-07-07: qty 1

## 2013-07-07 NOTE — Progress Notes (Signed)
Adult Psychoeducational Group Note  Date:  07/07/2013 Time:  1:15PM  Group Topic/Focus:  Healthy Support Systems  Participation Level:  Did Not Attend  Additional Comments:  Pt opted not to attend the group session.   Zacarias Pontes R 07/07/2013, 3:11 PM

## 2013-07-07 NOTE — Progress Notes (Signed)
Patient ID: Tyler Lambert, male   DOB: 10-26-1975, 37 y.o.   MRN: [id number] D)  Pt was admitted to the unit, made a phone call shortly after, and went to his room where he was given his dinner.  Has been lying quietly in the dark, stated he had been working in [employer], but work had "dried up",  had been having trouble making ends meet, and had begun doing things he shouldn't have.  Stated he has a 38 yo boy, wants to be a better father.  Affect is flat, sad.  Dsgs to wrists are both showing bloody drainage.  Pt was taken to the treatment room where NP removed dsgs, cleaned the lacerations and applied additional steri strips to sutured areas.  Neosporin and DSD applied to each wrist.  Was taken back to his room where he went to bed.  Was given motrin and trazodone ,  Has been resting. A)  Will continue to monitor for safety, continue POC R)  Safety maintained.
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
## 2013-07-07 NOTE — B[facility] Group Notes (Signed)
[facility] Group Notes: (Clinical Social Work)   07/07/2013      Type of Therapy:  Group Therapy   Participation Level:  Did Not Attend    Ambrose Mantle, LCSW 07/07/2013, 4:33 PM

## 2013-07-07 NOTE — Progress Notes (Signed)
Adult Psychoeducational Group Note  Date:  07/07/2013 Time:  10:19 PM  Group Topic/Focus:  Goals Group:   The focus of this group is to help patients establish daily goals to achieve during treatment and discuss how the patient can incorporate goal setting into their daily lives to aide in recovery.  Participation Level:  Active  Participation Quality:  Appropriate  Affect:  Blunted  Cognitive:  Appropriate  Insight: Appropriate  Engagement in Group:  Engaged  Modes of Intervention:  Discussion  Additional Comments:  Pt state that he day was ok, nothing really stood out just was a ok day.  Terie Purser R 07/07/2013, 10:19 PM

## 2013-07-07 NOTE — Progress Notes (Signed)
Psychoeducational Group Note  Psychoeducational Group Note  Date: 07/07/2013 Time:  0930 Group Topic/Focus:  Gratefulness:  The focus of this group is to help patients identify what two things they are most grateful for in their lives. What helps ground them and to center them on their work to their recovery.  Participation Level:  Did not attend  Morena Mckissack A  RN 

## 2013-07-07 NOTE — [facility] Suicide Risk Assessment (Signed)
Suicide Risk Assessment  Admission Assessment     Nursing information obtained from:    Demographic factors:    Current Mental Status:    Loss Factors:    Historical Factors:    Risk Reduction Factors:     CLINICAL FACTORS:   Depression:   Anhedonia Hopelessness Impulsivity Severe Unstable or Poor Therapeutic Relationship Previous Psychiatric Diagnoses and Treatments  COGNITIVE FEATURES THAT CONTRIBUTE TO RISK:  Closed-mindedness Polarized thinking    SUICIDE RISK:   Severe:  Frequent, intense, and enduring suicidal ideation, specific plan, no subjective intent, but some objective markers of intent (i.e., choice of lethal method), the method is accessible, some limited preparatory behavior, evidence of impaired self-control, severe dysphoria/symptomatology, multiple risk factors present, and few if any protective factors, particularly a lack of social support.  PLAN OF CARE:  I certify that inpatient services furnished can reasonably be expected to improve the patient's condition.  Manu Rubey 07/07/2013, 11:13 AM
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
## 2013-07-07 NOTE — Progress Notes (Signed)
D Kolbey has spent much of the day...laying in his bed in his room. He avoids eye contact. HE has little to say to his peers and / or the staff. He is observed with white guaze bandages covering both hands up to his antecubitals.They are clean, dry and intact with no signs of bleeding and / or breakthrough.    A He requests and is given nicorette gum for nicotine withdrawal. PA is called at 1800, requesting pt be restarted on his klonopin and is medicated per MD order. n and tramadol andthese orders are explained to pt and he demonstrates understanding .    R Safety is in place. POC includes contiuniung to encourage pt to share, talk and to trust.

## 2013-07-08 MED ORDER — CELECOXIB 400 MG PO CAPS
400.0000 mg | ORAL_CAPSULE | Freq: Every day | ORAL | Status: DC
  Filled 2013-07-08: qty 1

## 2013-07-08 MED ORDER — ARIPIPRAZOLE 10 MG PO TABS
10.0000 mg | ORAL_TABLET | Freq: Every day | ORAL | Status: DC
  Administered 2013-07-08 – 2013-07-09 (×2): 10 mg via ORAL
  Filled 2013-07-08 (×3): qty 1

## 2013-07-08 MED ORDER — CELECOXIB 200 MG PO CAPS
200.0000 mg | ORAL_CAPSULE | Freq: Two times a day (BID) | ORAL | Status: DC
  Administered 2013-07-08 – 2013-07-11 (×6): 200 mg via ORAL
  Filled 2013-07-08 (×10): qty 1

## 2013-07-08 NOTE — Progress Notes (Signed)
Patient ID: Tyler Lambert, male   DOB: 09-12-1975, 37 y.o.   MRN: [id number] D)  Has been rather quiet this evening, isolative, spent most of the evening in his room, coming out long enough to go to group and have a snack and take his hs meds.  Affect is very flat, sad, depressed.  Admits to suicidal thoughts but able to contract for safety.  Had been medicated on previous shift for pain in his wrists that he rated as 5, and pain was relieved.  Stated his visit earlier today with his mother and brother went as well as it could have.  Poor eye contact, doesn't initiate conversation unless a need, minimal conversation with staff and peers.  Dressings on wrists are dry and intact.  Currently denies pain, resting quietly. A)  Will continue to monitor for safety, continue POC R)  Safety maintained.

## 2013-07-08 NOTE — Progress Notes (Signed)
Adult Psychoeducational Group Note  Date:  07/08/2013 Time:  10:54 PM  Group Topic/Focus:  Goals Group:   The focus of this group is to help patients establish daily goals to achieve during treatment and discuss how the patient can incorporate goal setting into their daily lives to aide in recovery.  Participation Level:  Active  Participation Quality:  Appropriate  Affect:  Appropriate  Cognitive:  Appropriate  Insight: Appropriate  Engagement in Group:  Engaged  Modes of Intervention:  Discussion  Additional Comments:  Pt stated that if he takes better care of himself things in his life will tune out much better.  Terie Purser R 07/08/2013, 10:54 PM

## 2013-07-08 NOTE — Tx Team (Signed)
Interdisciplinary Treatment Plan Update   Date Reviewed:  07/08/2013  Time Reviewed:  9:41 AM  Progress in Treatment:   Attending groups: Yes Participating in groups: Yes Taking medication as prescribed: Yes  Tolerating medication: Yes Family/Significant other contact made: No, but will ask patient for consent for collateral contact Patient understands diagnosis: Yes  Discussing patient identified problems/goals with staff: Yes Medical problems stabilized or resolved: Yes Denies suicidal/homicidal ideation: Yes Patient has not harmed self or others: Yes  For review of initial/current patient goals, please see plan of care.  Estimated Length of Stay:  3-5 days  Reasons for Continued Hospitalization:  Anxiety Depression Medication stabilization Suicidal ideation  New Problems/Goals identified:    Discharge Plan or Barriers:   Home with outpatient follow up to be determined  Additional Comments:  Tyler Lambert is a 37 year old SWM who was arrested for possession of stolen goods by the [employer]. While he was being questioned he relates that the arresting officers left him unattended in the interrogation room with his knife on the table. He felt overwhelmed at the arrest and grabbed the knife and cut his wrists. He denies previous suicide attempts but does note that he cut his wrists previously in an attempt to release pain. [name] states that he was depressed but medication compliant prior to the arrest. He states that his suicide attempt was a reaction to being arrested. He feels ashamed and embarrassed.    Attendees:  Patient:  07/08/2013 9:41 AM   Signature: Mervyn Gay, MD 07/08/2013 9:41 AM  Signature:  Verne Spurr, PA 07/08/2013 9:41 AM  Signature: Harold Barban, RN 07/08/2013 9:41 AM  Signature:Beverly Terrilee Croak, RN 07/08/2013 9:41 AM  Signature:  Neill Loft RN 07/08/2013 9:41 AM  Signature:  Juline Patch, LCSW 07/08/2013 9:41 AM  Signature:  Reyes Ivan, LCSW  07/08/2013 9:41 AM  Signature:  Sharin Grave Coordinator 07/08/2013 9:41 AM  Signature:  Aloha Gell, RN 07/08/2013 9:41 AM  Signature: Leighton Parody, RN 07/08/2013  9:41 AM  Signature:   Onnie Boer, RN [facility] 07/08/2013  9:41 AM  Signature:  Elliot Cousin, RN 07/08/2013  9:41 AM    Scribe for Treatment Team:   Juline Patch,  07/08/2013 9:41 AM

## 2013-07-08 NOTE — Progress Notes (Signed)
Adult Psychoeducational Group Note  Date:  07/08/2013 Time:  10:55 PM  Group Topic/Focus:  Goals Group:   The focus of this group is to help patients establish daily goals to achieve during treatment and discuss how the patient can incorporate goal setting into their daily lives to aide in recovery.  Participation Level:  Active  Participation Quality:  Appropriate  Affect:  Appropriate  Cognitive:  Appropriate  Insight: Appropriate  Engagement in Group:  Engaged  Modes of Intervention:  Discussion  Additional Comments:  Pt  Stated that if he takes care of himself that everything to turn out great.  Terie Purser R 07/08/2013, 10:55 PM

## 2013-07-08 NOTE — Progress Notes (Signed)
Patient ID: Tyler Lambert, male   DOB: 1975-11-14, 37 y.o.   MRN: [id number] D: Pt is awake and active on the unit this AM. Pt denies SI/HI and A/V hallucinations. Pt mood is sad/depressed and his affect is flat. Pt state that he feels "stupid" for cutting his wrists when he has a child at home who depends on him. Writer encouraged pt to focus on moving forward and follow his discharge plan in order to make progress with his stressors. Pt is somewhat isolative but is present in the milieu.   A: Encouraged pt to discuss feelings with staff and administered medication per MD orders. Writer also encouraged pt to participate in groups.  R: Pt is attending groups and tolerating medications well. Writer will continue to monitor. 15 minute checks are ongoing for safety.

## 2013-07-08 NOTE — Progress Notes (Signed)
[facility] MD Progress Note  07/08/2013 2:20 PM HERBERT MARKEN  MRN:  [id number] Subjective: Tyler Lambert is participating in some of the groups. He states he rates his depression as a 8/10 and his anxiety is an 8/10. He is filled with regret and embarrassment over being arrested. He says he slept "decent" and his appetite is "too good."  Miloh's mother and brother did come to visit him and they are supportive of him. He denies intent to self harm but is overwhelmed with guilt.  Diagnosis:   DSM5: DSM5:  Schizophrenia Disorders:  Obsessive-Compulsive Disorders:  Trauma-Stressor Disorders: Posttraumatic Stress Disorder (309.81)  Substance/Addictive Disorders:  Depressive Disorders: Major Depressive Disorder - Moderate (296.22)  AXIS I: Adjustment Disorder NOS, Mood disorder, unspecified  AXIS II: Deferred  AXIS III:  Past Medical History   Diagnosis  Date   .  Anxiety    .  Depression    .  Acid reflux    .  Irritable bowel syndrome    .  PTSD (post-traumatic stress disorder)    .  Bipolar 1 disorder    .  ADHD (attention deficit hyperactivity disorder)    .  Insomnia     AXIS IV: occupational problems, problems related to legal system/crime and problems with primary support group  AXIS V: 41-50 serious symptoms  ADL's:  Intact  Sleep: Good  Appetite:  Good  Suicidal Ideation:  denies Homicidal Ideation:  denies AEB (as evidenced by):  Psychiatric Specialty Exam: Review of Systems  Constitutional: Negative.  Negative for fever, chills, weight loss, malaise/fatigue and diaphoresis.  HENT: Negative for congestion and sore throat.   Eyes: Negative for blurred vision, double vision and photophobia.  Respiratory: Negative for cough, shortness of breath and wheezing.   Cardiovascular: Negative for chest pain, palpitations and PND.  Gastrointestinal: Negative for heartburn, nausea, vomiting, abdominal pain, diarrhea and constipation.  Musculoskeletal: Positive for joint pain (knees continue  to hurt.). Negative for falls and myalgias.  Neurological: Negative for dizziness, tingling, tremors, sensory change, speech change, focal weakness, seizures, loss of consciousness, weakness and headaches.  Endo/Heme/Allergies: Negative for polydipsia. Does not bruise/bleed easily.  Psychiatric/Behavioral: Negative for depression, suicidal ideas, hallucinations, memory loss and substance abuse. The patient is not nervous/anxious and does not have insomnia.     Blood pressure 112/71, pulse 83, temperature 97.3 F (36.3 C), temperature source Oral, resp. rate 16, height 5\' 8"  (1.727 m), weight 83.462 kg (184 lb).Body mass index is 27.98 kg/(m^2).  General Appearance: Fairly Groomed  Patent attorney::  Fair  Speech:  Slow  Volume:  Decreased  Mood:  Depressed  Affect:  Depressed and Flat  Thought Process:  Goal Directed  Orientation:  Full (Time, Place, and Person)  Thought Content:  WDL  Suicidal Thoughts:  Yes.  without intent/plan  Homicidal Thoughts:  No  Memory:  NA  Judgement:  Impaired  Insight:  Shallow  Psychomotor Activity:  Decreased  Concentration:  Fair  Recall:  Fair  Akathisia:  No  Handed:  Right  AIMS (if indicated):     Assets:  Communication Skills Desire for Improvement Housing Physical Health Social Support  Sleep:  Number of Hours: 6.75   Current Medications: Current Facility-Administered Medications  Medication Dose Route Frequency Provider Last Rate Last Dose  . acetaminophen (TYLENOL) tablet 650 mg  650 mg Oral Q6H PRN Earney Navy, NP      . alum & mag hydroxide-simeth (MAALOX/MYLANTA) 200-200-20 MG/5ML suspension 30 mL  30 mL Oral Q4H  PRN Earney Navy, NP      . celecoxib (CELEBREX) capsule 200 mg  200 mg Oral Daily Verne Spurr, PA-C   200 mg at 07/08/13 0825  . clonazePAM (KLONOPIN) tablet 0.5 mg  0.5 mg Oral BID Verne Spurr, PA-C   0.5 mg at 07/08/13 0825  . hydrOXYzine (ATARAX/VISTARIL) tablet 50 mg  50 mg Oral QHS PRN Earney Navy, NP      . ibuprofen (ADVIL,MOTRIN) tablet 600 mg  600 mg Oral Q8H PRN Earney Navy, NP   600 mg at 07/07/13 0634  . magnesium hydroxide (MILK OF MAGNESIA) suspension 30 mL  30 mL Oral Daily PRN Earney Navy, NP      . nicotine polacrilex (NICORETTE) gum 2 mg  2 mg Oral PRN Verne Spurr, PA-C   2 mg at 07/08/13 0827  . PARoxetine (PAXIL) tablet 40 mg  40 mg Oral QHS Verne Spurr, PA-C   40 mg at 07/07/13 2229  . traMADol (ULTRAM) tablet 50 mg  50 mg Oral Q8H PRN Verne Spurr, PA-C   50 mg at 07/08/13 0827  . traZODone (DESYREL) tablet 50 mg  50 mg Oral QHS PRN Kristeen Mans, NP   50 mg at 07/07/13 2228    Lab Results:  Results for orders placed during the hospital encounter of 07/06/13 (from the past 48 hour(s))  CBC     Status: Abnormal   Collection Time    07/06/13  4:00 PM      Result Value Range   WBC 9.9  4.0 - 10.5 K/uL   RBC 3.93 (*) 4.22 - 5.81 MIL/uL   Hemoglobin 11.7 (*) 13.0 - 17.0 g/dL   HCT 04.5 (*) 40.9 - 81.1 %   MCV 85.0  78.0 - 100.0 fL   MCH 29.8  26.0 - 34.0 pg   MCHC 35.0  30.0 - 36.0 g/dL   RDW 91.4  78.2 - 95.6 %   Platelets 214  150 - 400 K/uL  COMPREHENSIVE METABOLIC PANEL     Status: None   Collection Time    07/06/13  4:00 PM      Result Value Range   Sodium 135  135 - 145 mEq/L   Potassium 3.6  3.5 - 5.1 mEq/L   Chloride 101  96 - 112 mEq/L   CO2 19  19 - 32 mEq/L   Glucose, Bld 76  70 - 99 mg/dL   BUN 12  6 - 23 mg/dL   Creatinine, Ser 2.13  0.50 - 1.35 mg/dL   Calcium 9.0  8.4 - 08.6 mg/dL   Total Protein 7.1  6.0 - 8.3 g/dL   Albumin 3.8  3.5 - 5.2 g/dL   AST 30  0 - 37 U/L   ALT 44  0 - 53 U/L   Alkaline Phosphatase 47  39 - 117 U/L   Total Bilirubin 0.5  0.3 - 1.2 mg/dL   GFR calc non Af Amer >90  >90 mL/min   GFR calc Af Amer >90  >90 mL/min   Comment: (NOTE)     The eGFR has been calculated using the CKD EPI equation.     This calculation has not been validated in all clinical situations.     eGFR's persistently  <90 mL/min signify possible Chronic Kidney     Disease.  ACETAMINOPHEN LEVEL     Status: None   Collection Time    07/06/13  4:00 PM  Result Value Range   Acetaminophen (Tylenol), Serum <15.0  10 - 30 ug/mL   Comment:            THERAPEUTIC CONCENTRATIONS VARY     SIGNIFICANTLY. A RANGE OF 10-30     ug/mL MAY BE AN EFFECTIVE     CONCENTRATION FOR MANY PATIENTS.     HOWEVER, SOME ARE BEST TREATED     AT CONCENTRATIONS OUTSIDE THIS     RANGE.     ACETAMINOPHEN CONCENTRATIONS     >150 ug/mL AT 4 HOURS AFTER     INGESTION AND >50 ug/mL AT 12     HOURS AFTER INGESTION ARE     OFTEN ASSOCIATED WITH TOXIC     REACTIONS.  SALICYLATE LEVEL     Status: Abnormal   Collection Time    07/06/13  4:00 PM      Result Value Range   Salicylate Lvl <2.0 (*) 2.8 - 20.0 mg/dL  ETHANOL     Status: None   Collection Time    07/06/13  4:00 PM      Result Value Range   Alcohol, Ethyl (B) <11  0 - 11 mg/dL   Comment:            LOWEST DETECTABLE LIMIT FOR     SERUM ALCOHOL IS 11 mg/dL     FOR MEDICAL PURPOSES ONLY  URINE RAPID DRUG SCREEN (HOSP PERFORMED)     Status: Abnormal   Collection Time    07/06/13  4:09 PM      Result Value Range   Opiates NONE DETECTED  NONE DETECTED   Cocaine NONE DETECTED  NONE DETECTED   Benzodiazepines POSITIVE (*) NONE DETECTED   Amphetamines NONE DETECTED  NONE DETECTED   Tetrahydrocannabinol NONE DETECTED  NONE DETECTED   Barbiturates NONE DETECTED  NONE DETECTED   Comment:            DRUG SCREEN FOR MEDICAL PURPOSES     ONLY.  IF CONFIRMATION IS NEEDED     FOR ANY PURPOSE, NOTIFY LAB     WITHIN 5 DAYS.                LOWEST DETECTABLE LIMITS     FOR URINE DRUG SCREEN     Drug Class       Cutoff (ng/mL)     Amphetamine      1000     Barbiturate      200     Benzodiazepine   200     Tricyclics       300     Opiates          300     Cocaine          300     THC              50    Physical Findings: AIMS: Facial and Oral Movements Muscles of  Facial Expression: None, normal Lips and Perioral Area: None, normal Jaw: None, normal Tongue: None, normal,Extremity Movements Upper (arms, wrists, hands, fingers): None, normal Lower (legs, knees, ankles, toes): None, normal, Trunk Movements Neck, shoulders, hips: None, normal, Overall Severity Severity of abnormal movements (highest score from questions above): None, normal Incapacitation due to abnormal movements: None, normal Patient's awareness of abnormal movements (rate only patient's report): No Awareness, Dental Status Current problems with teeth and/or dentures?: No Does patient usually wear dentures?: No  CIWA:    COWS:     Treatment  Plan Summary: Daily contact with patient to assess and evaluate symptoms and progress in treatment Medication management  Plan: 1. Continue crisis management and stabilization. 2. Medication management to reduce current symptoms to base line and improve patient's overall level of functioning 3. Treat health problems as indicated. 4. Develop treatment plan to decrease risk of relapse upon discharge and the need for readmission. 5. Psycho-social education regarding relapse prevention and self care. 6. Health care follow up as needed for medical problems. 7. Continue home medications where appropriate. 8. Will add Abilify to his regimen as he reports he is supposed to be on this. 9. ELOS: 2 days.  Medical Decision Making Problem Points:  Established problem, stable/improving (1) and Review of psycho-social stressors (1) Data Points:  Review or order medicine tests (1)  I certify that inpatient services furnished can reasonably be expected to improve the patient's condition.  Rona Ravens. Mashburn RPAC 2:27 PM 07/08/2013  Reviewed the information documented and agree with the treatment plan.  Tammera Engert,JANARDHAHA R. 07/08/2013 6:11 PM

## 2013-07-08 NOTE — Progress Notes (Signed)
Adult Psychoeducational Group Note  Date:  07/08/2013 Time:  11:00am Group Topic/Focus:  Self Care:   The focus of this group is to help patients understand the importance of self-care in order to improve or restore emotional, physical, spiritual, interpersonal, and financial health.  Participation Level:  Did Not Attend  Participation Quality:    Affect:   Cognitive:    Insight:   Engagement in Group:   Modes of Intervention:    Additional Comments:  Pt did not attend group  Shelly Bombard D 07/08/2013, 1:52 PM

## 2013-07-08 NOTE — BHH Group Notes (Signed)
[facility] LCSW Aftercare Discharge Planning Group Note   07/08/2013 3:27 PM    Participation Quality:  Appropraite  Mood/Affect:  Appropriate  Depression Rating:  9  Anxiety Rating:  8  Thoughts of Suicide:  No  Will you contract for safety?   NA  Current AVH:  No  Plan for Discharge/Comments:  Patient attended discharge planning group and actively participated in group.  He reports he was seeing [facility] for outpatient services.  CSW provided all participants with daily workbook.   Transportation Means: Patient has transportation.   Supports:  Patient has a support system.   Doretta Remmert, [name]

## 2013-07-08 NOTE — Progress Notes (Signed)
D-pt sts hes very depressed today, mother came to have lunch with pt A-pt attended groups, pt took his medications R-safety maintained

## 2013-07-09 MED ORDER — ARIPIPRAZOLE 15 MG PO TABS
15.0000 mg | ORAL_TABLET | Freq: Every day | ORAL | Status: DC
  Administered 2013-07-10 – 2013-07-11 (×2): 15 mg via ORAL
  Filled 2013-07-09 (×2): qty 1
  Filled 2013-07-09: qty 14
  Filled 2013-07-09: qty 1

## 2013-07-09 NOTE — Progress Notes (Signed)
The focus of this group is to educate the patient on the purpose and policies of crisis stabilization and provide a format to answer questions about their admission.  The group details unit policies and expectations of patients while admitted.  Patient attended 0900 nurse education orientation group this morning.  Patient listened, appropriate affect, alert, appropriate insight and engagement.  Today patient will work on 3 goals for discharge.  

## 2013-07-09 NOTE — Progress Notes (Signed)
Adult Psychoeducational Group Note  Date:  07/09/2013 Time:  11:00am  Group Topic/Focus:  Recovery Goals:   The focus of this group is to identify appropriate goals for recovery and establish a plan to achieve them.  Participation Level:  Active  Participation Quality:  Appropriate and Attentive  Affect:  Appropriate  Cognitive:  Alert and Appropriate  Insight: Appropriate  Engagement in Group:  Engaged  Modes of Intervention:  Discussion and Education  Additional Comments:  Pt did not attend group  Shelly Bombard D 07/09/2013, 2:16 PM

## 2013-07-09 NOTE — Progress Notes (Signed)
Adult Psychoeducational Group Note  Date:  07/09/2013 Time:  8:00 pm  Group Topic/Focus:  Wrap-Up Group:   The focus of this group is to help patients review their daily goal of treatment and discuss progress on daily workbooks.  Participation Level:  Active  Participation Quality:  Appropriate and Sharing  Affect:  Appropriate  Cognitive:  Appropriate  Insight: Appropriate  Engagement in Group:  Engaged  Modes of Intervention:  Discussion, Education, Socialization and Support  Additional Comments:  Pt stated that he is in the hospital for depression. Pt stated that he is laid back and a nice person.   Laural Benes, Kaylei Frink 07/09/2013, 9:26 PM
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
## 2013-07-09 NOTE — Progress Notes (Signed)
Recreation Therapy Notes  Date: 11.24.2014 Time: 3:00pm Location: [address] Day[address]  Group Topic: Wellness  Goal Area(s) Addresses:  Patient will define components of whole wellness. Patient will verbalize benefit of whole wellness.  Behavioral Response: Engaged, Appropriate   Intervention: Group Mind Map  Activity: Patients were asked to create a group flow chart highlighting the dimensions of wellness, as well as things they can do to personally invest in wellness.    Education: Wellness, Personal Care.    Education Outcome: Acknowledges understanding   Clinical Observations/Feedback: Patient with peers identified physical, mental, social, environmental, emotional, intellectual, leisure, financial and spiritual health as parts of wellness. Patient actively participated in group session, defining and giving examples of each dimension of wellness. Patient shared that he gets most of his leisure wellness health to his son. Stating that the time he spends with his son helps him get out of the house and helps his depression. Patient additionally related this to his emotional and mental health, which contributes positively to his overall wellness.    Marykay Lex Keene Gilkey, LRT/CTRS  Jearl Klinefelter 07/09/2013 9:36 AM

## 2013-07-09 NOTE — Progress Notes (Signed)
Patient ID: RY MOODY, male   DOB: 21-Mar-1976, 37 y.o.   MRN: [id number]   D: Pt appeared to be focused on his med changes. Stated that his klonopin and tramadol were changed, and that he "wished they were left the same". Writer asked pt if he's noticed a difference in how he feels. Pt stated, "I'm not happy with it". Writer asked pt if he noticed any changes in his anxiety level informing pt that sometimes dr's make changes to start with the least amount in order to see the least amount the pt needs to have optimal affect. Pt informed the writer that he was nervous at the time of the assessment. Informed pt that he had 50mg  of vistaril for bed.  A: Pt was given scheduled meds. Support and encouragement was offered. 15 min checks continued for safety.  R: Pt remains safe.

## 2013-07-09 NOTE — Clinical Social Work Note (Signed)
Writer met with patient individually.  He continues to be depressed and concerned about the legal consequences as actions that led to hospitalization.  Patient shared he finally has custody of son and he is concerned he may end up with jail and not be with him.  Patient shared since his son came to live with him, he has taken down the dark blinds in his bedroom and spends more time enjoying outside activities.

## 2013-07-09 NOTE — Progress Notes (Signed)
D:  Patient's self inventory sheet, patient sleeps well, good appetite, low energy level, improving attention span.  Rated depression, hopeless and anxiety 8.  Denied withdrawals.  SI off/on, contracts for safety.  Has had knee pain in past 24 hours.  Worst pain 7.  Plans to find a way?  No problems taking medications after discharge. A:  Medications administered per MD orders.  Emotional support and encouragement given patient. R:  SI off/on, contracts for safety.  Denied HI.   Does hear voices telling him to hurt himself.  Denied visual hallucinations.  Will continue to monitor patient for safety with 15 minute checks.  Safety maintained. Dressing changed on bilateral wrists.  No signs/symptoms of infections, no drainage, swelling, no odor.

## 2013-07-09 NOTE — [facility] Group Notes (Signed)
[facility] LCSW Group Therapy      Feelings About Diagnosis 1:15 - 2:30 PM         07/09/2013  3:03 PM    Type of Therapy:  Group Therapy  Participation Level:  Active  Participation Quality:  Appropriate  Affect:  Appropriate  Cognitive:  Alert and Appropriate  Insight:  Developing/Improving and Engaged  Engagement in Therapy:  Developing/Improving and Engaged  Modes of Intervention:  Discussion, Education, Exploration, Problem-Solving, Rapport Building, Support  Summary of Progress/Problems:  Patient actively participated in group. Patient discussed past and present diagnosis and the effects it has had on his life.  Patient talked about family and society being judgmental and the stigma associated with having a mental health diagnosis. He shared he was embarrassed with the diagnosis at first but found out there were members of his family who he respected who have the same diagnosis.  He no longer feels his diagnosis is a label.  Wynn Banker 07/09/2013  3:03 PM

## 2013-07-09 NOTE — Progress Notes (Signed)
Recreation Therapy Notes  Date: 11.25.2014 Time: 2:45pm Location: [address] Dayroom   Group Topic: Animal Assisted Activities (AAA)  Behavioral Response: Engaged, Appropriate   Affect: Euthymic  Clinical Observations/Feedback: Dog Team: Joffre & handler. Patient interacted appropriately with peer, dog team, and LRT.   Maurianna Benard L Jaquel Glassburn, LRT/CTRS  Aleli Navedo L 07/09/2013 5:01 PM 

## 2013-07-09 NOTE — [facility] Suicide Risk Assessment (Signed)
[facility] INPATIENT:  Family/Significant Other Suicide Prevention Education  Suicide Prevention Education:  Patient Refusal for Family/Significant Other Suicide Prevention Education: The patient Tyler Lambert has refused to provide written consent for family/significant other to be provided Family/Significant Other Suicide Prevention Education during admission and/or prior to discharge.  Physician notified.  Patient advised of not wanting to involve family.  Wynn Banker 07/09/2013, 11:58 AM

## 2013-07-10 MED ORDER — HYDROXYZINE HCL 50 MG PO TABS
50.0000 mg | ORAL_TABLET | Freq: Three times a day (TID) | ORAL | Status: DC | PRN
  Administered 2013-07-10 – 2013-07-11 (×3): 50 mg via ORAL
  Filled 2013-07-10: qty 1
  Filled 2013-07-10: qty 42
  Filled 2013-07-10 (×2): qty 1

## 2013-07-10 NOTE — [facility] Group Notes (Signed)
[facility] LCSW Group Therapy  Emotional Regulation 1:15 - 2:30  07/10/2013 2:20 PM  Type of Therapy:  Group Therapy  Participation Level:  Patient left group shortly after it started.  Wynn Banker 07/10/2013, 2:20 PM

## 2013-07-10 NOTE — Progress Notes (Signed)
This Clinical research associate contacted Theola Sequin, hospital attorney regarding police custody issue. Patient was in police custody when admitted to [facility] 07/06/13 after treatment of s/p self-inflicted wrist lacerations in the [facility]. On 07/09/13, Mr.Carter reported to me that since pt. was in custody during the self-inflicted injury, treatment at [facility] and at time of admission to [facility], and not released  from police custody, that we can contact law enforcement in [facility] prior to discharge. Call made to Chief of Police [facility])  Marcelle Smiling at [phone], mailbox full.  Discussed with Walker Kehr, Lead Social Worker who will follow-up later today with this Clinical research associate.Denver Faster RN [facility]

## 2013-07-10 NOTE — Progress Notes (Signed)
[facility] Adult Case Management Discharge Plan :  Will you be returning to the same living situation after discharge: No. At discharge, do you have transportation home?:Yes,  Patient to be transported by [facility] police. Do you have the ability to pay for your medications:Yes,  Patient has Medicaid.  Release of information consent forms completed and in the chart;  Patient's signature needed at discharge.  Patient to Follow up at: Follow-up Information   Follow up with El[facility] On 07/16/2013. (Tuesday, July 16, 2013 at 10AM)    Contact information:   [address] [place], [place]   [address]  [phone]      Follow up with Dr. Jannifer Franklin [facility] On 08/06/2013. (You are scheduled with Dr. Jannifer Franklin on Tuesday, August 06, 2013 at 12:00 [facility])    Contact information:   [address] [place], [place]   [address]  [phone]      Patient denies SI/HI: Patient no longer endorsing SI/HI or other thoughts of self harm.   Safety Planning and Suicide Prevention discussed: .Reviewed with all patients during discharge planning group  Kimmie Doren, [name] 07/10/2013, 1:10 PM

## 2013-07-10 NOTE — Progress Notes (Signed)
LCSW was called by AD Clint Bolder with assistance in contacting police due to patient possibly being DC today. LCSW contacted Jackquline Denmark (head of security with [facility]) who contacted [facility] as well as Theola Sequin hospital attorney.  Per Molly Maduro: Security can call for pick up due to patient having warrants and in custody of [facility].  SW will not call police, but will call Jackquline Denmark with patient DC time once known.  Roe Coombs is working on a policy as patient should have papers in chart with warrant and custody order.  [place] Police was able to facilitate this documentation for Roe Coombs in efforts to not violate patient HIPPA and abide by law. LCSW will update AD as well as LCSW on unit.  Ashley Jacobs, MSW, LCSW Clinical Lead [phone]

## 2013-07-10 NOTE — [facility] Group Notes (Signed)
[facility] LCSW Aftercare Discharge Planning Group Note   07/10/2013 10:10 AM    Participation Quality:  Appropraite  Mood/Affect:  Appropriate  Depression Rating:  7  Anxiety Rating:  7  Thoughts of Suicide:  No  Will you contract for safety?   NA  Current AVH:  No  Plan for Discharge/Comments:  Patient attended discharge planning group and actively participated in group. Patient will follow up with Dr. Jann[facility] and [facility].   CSW provided all participants with daily workbook.   Transportation Means: Patient has transportation.   Supports:  Patient has a support system.   Romey Mathieson, [name]

## 2013-07-10 NOTE — Progress Notes (Signed)
Patient ID: Tyler Lambert, male   DOB: 1975/11/16, 37 y.o.   MRN: [id number] [facility] MD Progress Note  07/10/2013 1:09 PM Tyler Lambert  MRN:  [id number]  Subjective: Patient was to discharge today but is understandably anxious about what happens next. He didn't sleep well and his appetite is down some. He asks for an increase in his anxiety medication. He rates his anxiety as a 10/10 today, his depression is an 8/10.      He has not called the [facility] department as he was encouraged to do yesterday. He denies SI/HI or AVH. But is still filled with regret and embarrassment over the incident. He would like to wait until tomorrow to d/c. Diagnosis:   DSM5: DSM5:  Schizophrenia Disorders:  Obsessive-Compulsive Disorders:  Trauma-Stressor Disorders: Posttraumatic Stress Disorder (309.81)  Substance/Addictive Disorders:  Depressive Disorders: Major Depressive Disorder - Moderate (296.22)  AXIS I: Adjustment Disorder NOS, Mood disorder, unspecified  AXIS II: Deferred  AXIS III:  Past Medical History   Diagnosis  Date   .  Anxiety    .  Depression    .  Acid reflux    .  Irritable bowel syndrome    .  PTSD (post-traumatic stress disorder)    .  Bipolar 1 disorder    .  ADHD (attention deficit hyperactivity disorder)    .  Insomnia     AXIS IV: occupational problems, problems related to legal system/crime and problems with primary support group  AXIS V: 41-50 serious symptoms  ADL's:  Intact  Sleep: Good  Appetite:  Good  Suicidal Ideation:  denies Homicidal Ideation:  denies AEB (as evidenced by):  Psychiatric Specialty Exam: Review of Systems  Constitutional: Negative.  Negative for fever, chills, weight loss, malaise/fatigue and diaphoresis.  HENT: Negative for congestion and sore throat.   Eyes: Negative for blurred vision, double vision and photophobia.  Respiratory: Negative for cough, shortness of breath and wheezing.   Cardiovascular: Negative for chest pain,  palpitations and PND.  Gastrointestinal: Negative for heartburn, nausea, vomiting, abdominal pain, diarrhea and constipation.  Musculoskeletal: Positive for joint pain (knees continue to hurt.). Negative for falls and myalgias.  Neurological: Negative for dizziness, tingling, tremors, sensory change, speech change, focal weakness, seizures, loss of consciousness, weakness and headaches.  Endo/Heme/Allergies: Negative for polydipsia. Does not bruise/bleed easily.  Psychiatric/Behavioral: Negative for depression, suicidal ideas, hallucinations, memory loss and substance abuse. The patient is not nervous/anxious and does not have insomnia.     Blood pressure 102/63, pulse 105, temperature 97.6 F (36.4 C), temperature source Oral, resp. rate 16, height 5\' 8"  (1.727 m), weight 83.462 kg (184 lb).Body mass index is 27.98 kg/(m^2).  General Appearance: Fairly Groomed  Patent attorney::  Fair  Speech:  Slow  Volume:  Decreased  Mood:  Depressed  Affect:  Depressed and Flat  Thought Process:  Goal Directed  Orientation:  Full (Time, Place, and Person)  Thought Content:  WDL  Suicidal Thoughts:  Yes.  without intent/plan  Homicidal Thoughts:  No  Memory:  NA  Judgement:  Impaired  Insight:  Shallow  Psychomotor Activity:  Decreased  Concentration:  Fair  Recall:  Fair  Akathisia:  No  Handed:  Right  AIMS (if indicated):     Assets:  Communication Skills Desire for Improvement Housing Physical Health Social Support  Sleep:  Number of Hours: 6.75   Current Medications: Current Facility-Administered Medications  Medication Dose Route Frequency Provider Last Rate Last Dose  . acetaminophen (  TYLENOL) tablet 650 mg  650 mg Oral Q6H PRN Earney Navy, NP      . alum & mag hydroxide-simeth (MAALOX/MYLANTA) 200-200-20 MG/5ML suspension 30 mL  30 mL Oral Q4H PRN Earney Navy, NP   30 mL at 07/08/13 1902  . ARIPiprazole (ABILIFY) tablet 15 mg  15 mg Oral Daily Nehemiah Settle,  MD   15 mg at 07/10/13 0850  . celecoxib (CELEBREX) capsule 200 mg  200 mg Oral BID Nehemiah Settle, MD   200 mg at 07/10/13 0850  . clonazePAM (KLONOPIN) tablet 0.5 mg  0.5 mg Oral BID Verne Spurr, PA-C   0.5 mg at 07/10/13 0851  . hydrOXYzine (ATARAX/VISTARIL) tablet 50 mg  50 mg Oral TID PRN Verne Spurr, PA-C      . ibuprofen (ADVIL,MOTRIN) tablet 600 mg  600 mg Oral Q8H PRN Earney Navy, NP   600 mg at 07/07/13 0634  . magnesium hydroxide (MILK OF MAGNESIA) suspension 30 mL  30 mL Oral Daily PRN Earney Navy, NP      . nicotine polacrilex (NICORETTE) gum 2 mg  2 mg Oral PRN Verne Spurr, PA-C   2 mg at 07/10/13 0855  . PARoxetine (PAXIL) tablet 40 mg  40 mg Oral QHS Verne Spurr, PA-C   40 mg at 07/09/13 2125  . traMADol (ULTRAM) tablet 50 mg  50 mg Oral Q8H PRN Verne Spurr, PA-C   50 mg at 07/10/13 0854  . traZODone (DESYREL) tablet 50 mg  50 mg Oral QHS PRN Kristeen Mans, NP   50 mg at 07/09/13 2125    Lab Results:  No results found for this or any previous visit (from the past 48 hour(s)).  Physical Findings: AIMS: Facial and Oral Movements Muscles of Facial Expression: None, normal Lips and Perioral Area: None, normal Jaw: None, normal Tongue: None, normal,Extremity Movements Upper (arms, wrists, hands, fingers): None, normal Lower (legs, knees, ankles, toes): None, normal, Trunk Movements Neck, shoulders, hips: None, normal, Overall Severity Severity of abnormal movements (highest score from questions above): None, normal Incapacitation due to abnormal movements: None, normal Patient's awareness of abnormal movements (rate only patient's report): No Awareness, Dental Status Current problems with teeth and/or dentures?: No Does patient usually wear dentures?: No  CIWA:  CIWA-Ar Total: 1 COWS:  COWS Total Score: 2  Treatment Plan Summary: Daily contact with patient to assess and evaluate symptoms and progress in treatment Medication  management  Plan: 1. Increased the Vistaril to TID prn for anxiety as noted. Declined to increase or change the Klonopin due to his history of alcohol dependence. 2. Patient is given the phone number for the [facility] PD so he can call and ask what will happen next. This may help to relieve some of his extreme anxiety. 3. Will continue all medication as written with the above exception. 4. ELOS: will d/c in the AM.    Medical Decision Making Problem Points:  Established problem, stable/improving (1) and Review of psycho-social stressors (1) Data Points:  Review or order medicine tests (1)  I certify that inpatient services furnished can reasonably be expected to improve the patient's condition.  Rona Ravens. Mashburn RPAC 1:14 PM 07/10/2013 Agree with assessment and plan Madie Reno A. [place], [place].D

## 2013-07-10 NOTE — Progress Notes (Signed)
Recreation Therapy Notes  Date: 11.26.2014 Time: 3:00pm Location: 500 [address] Dayroom   Group Topic: Coping Skills  Goal Area(s) Addresses:  Patient will complete grateful mandala. Patient will identify benefit of identifying things he/she is grateful for.    Behavioral Response: Appropriate  Intervention: Mandala  Activity: I am Grateful Mandala. Patient was asked to identify things they are grateful for to fit in various categories such as Happiness, Laughter; Mind, Body, Spirit; Knowledge, Education  Education: Pharmacologist, Discharge Planning  Education Outcome: Acknowledges education   Clinical Observations/Feedback: Patient actively participated in group activity, identifying things, people or items to correspond with each category. Patient shared that he struggled with categories that have intrinsic principles, stating he is most grateful for his family. Patient shared he was unable to identify something he is grateful for in this moment, which again highlighted patient difficulty to focus on intrinsic factors vs extrinsic factors.   Marykay Lex Laquan Beier, LRT/CTRS  Jearl Klinefelter 07/10/2013 4:03 PM

## 2013-07-10 NOTE — Progress Notes (Signed)
Adult Psychoeducational Group Note  Date:  07/10/2013 Time:  9:15 PM  Group Topic/Focus:  Wrap-Up Group:   The focus of this group is to help patients review their daily goal of treatment and discuss progress on daily workbooks.  Participation Level:  Active  Participation Quality:  Appropriate  Affect:  Appropriate  Cognitive:  Appropriate  Insight: Appropriate  Engagement in Group:  Engaged  Modes of Intervention:  Support  Additional Comments:  Pt stated that one good thing that happened was that he was able to see his little boy. He states that doing the activity today about what he is grateful for has made him appreciative of the little things  Tyler Lambert 07/10/2013, 9:15 PM

## 2013-07-10 NOTE — Clinical Social Work Note (Signed)
Writer met with patient who is prepared to go to jail.  He stated he does not want to upset his family's Thanksgiving.  He shared he does not have a record so hopes things will go well for him.  Patient shared his son is his motivation to get this situation behind him and to continue living.
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
## 2013-07-10 NOTE — Clinical Social Work Note (Signed)
Writer spoke with PA who advised patient will not be discharging today due to his anxiety being too high with thoughts of having to go to jail.  She advised patient will be discharged tomorrow.  She was informed that no would be available on Thanksgiving Day to assist with notifying security as, Jackquline Denmark [phone]), head of security is to be contacted to arrange for [facility].  PA stated she would make the necessary call.

## 2013-07-10 NOTE — Progress Notes (Signed)
D: Patient has flat affect and depressed mood. Patient complained of knee pain 5/10; also c/o anxiety. Declined self inventory sheet today. He's attending groups and visible in the milieu; little to no interaction with other patients. Patient compliant with medication regimen.  A: Support and encouragement provided to patient. Administered scheduled medications per ordering MD. PRN Tramadol given for pain. Monitor Q15 minute checks for safety.  R: Patient receptive. He reported decrease in pain 1/10. Denies SI/HI and auditory/visual hallucinations. Patient remains safe on the unit.

## 2013-07-10 NOTE — Progress Notes (Signed)
Chaplain provided support with pt around discharge plans, tumultuous relationship with former spouse, grief around loss of relationship, support for son.   Belva Crome MDiv

## 2013-07-11 MED ORDER — ARIPIPRAZOLE 15 MG PO TABS: 15.0000 mg | ORAL_TABLET | Freq: Every day | ORAL | Status: DC

## 2013-07-11 MED ORDER — TRAZODONE HCL 50 MG PO TABS: 50.0000 mg | ORAL_TABLET | Freq: Every evening | ORAL | Status: DC | PRN

## 2013-07-11 MED ORDER — HYDROXYZINE HCL 50 MG PO TABS: 50.0000 mg | ORAL_TABLET | Freq: Three times a day (TID) | ORAL | Status: DC

## 2013-07-11 MED ORDER — CLONAZEPAM 0.5 MG PO TABS: 0.5000 mg | ORAL_TABLET | Freq: Two times a day (BID) | ORAL | Status: DC

## 2013-07-11 MED ORDER — PAROXETINE HCL 40 MG PO TABS: 40.0000 mg | ORAL_TABLET | Freq: Every day | ORAL | Status: DC

## 2013-07-11 MED ORDER — CELECOXIB 200 MG PO CAPS: 200.0000 mg | ORAL_CAPSULE | Freq: Two times a day (BID) | ORAL | Status: DC

## 2013-07-11 NOTE — Progress Notes (Signed)
D: pt asleep, no signs of distress or labored breathing A: q 15 min safety checks R: pt. Remains safe on unit 
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
## 2013-07-11 NOTE — [facility] Group Notes (Signed)
[facility] Group Notes:  (Nursing/MHT/Case Management/Adjunct)  Date:  07/11/2013  Time:  0915  Type of Therapy:  Psychoeducational Skills  Participation Level:  Active  Participation Quality:  Appropriate  Affect:  Appropriate  Cognitive:  Appropriate  Insight:  Appropriate  Engagement in Group:  Supportive  Modes of Intervention:  Support  Summary of Progress/Problems:Morning Wellness group  Manuela Schwartz [facility] 07/11/2013, 10:47 AM

## 2013-07-11 NOTE — Progress Notes (Signed)
Discharge Note: Discharge instructions/prescriptions/medication samples given to patient. Patient verbalized understanding of discharge instructions and prescriptions. Returned belongings to patient. Denies SI/HI/AVH. Patient d/c without incident to the lobby and transported by [employer].

## 2013-07-11 NOTE — Discharge Summary (Signed)
Physician Discharge Summary Note  Patient:  Tyler Lambert is an 37 y.o., male MRN:  [id number] DOB:  10-03-1975 Patient phone:  [phone] (home)  Patient address:   [address] [place] [address],   Date of Admission:  07/06/2013 Date of Discharge: 07/11/2013  Reason for Admission:  Suicide attempt  Discharge Diagnoses: Active Problems:   * No active hospital problems. *  ROS  DSM5: Schizophrenia Disorders:  Obsessive-Compulsive Disorders:  Trauma-Stressor Disorders: Posttraumatic Stress Disorder (309.81)  Substance/Addictive Disorders:  Depressive Disorders: Major Depressive Disorder - Moderate (296.22)  AXIS I: Adjustment Disorder NOS, Mood disorder, unspecified  AXIS II: Deferred  AXIS III:  Past Medical History   Diagnosis  Date   .  Anxiety    .  Depression    .  Acid reflux    .  Irritable bowel syndrome    .  PTSD (post-traumatic stress disorder)    .  Bipolar 1 disorder    .  ADHD (attention deficit hyperactivity disorder)    .  Insomnia     AXIS IV: occupational problems, problems related to legal system/crime and problems with primary support group  AXIS V: 41-50 serious symptoms  Level of Care:  OP  Hospital Course:  Tyler Lambert was admitted after he was brought in by police after he was arrested. The patient reports that while he was in the custody of the police he was able to grab his own pocket knife and slash his wrists.      He was evaluated and the lacerations to his wrists were significant and deep enough to require closure with sutures. Once he was medically cleared he was admitted to [facility] for further stabilization and crisis management.      Upon the unit Tyler Lambert was evaluated by a clinical provider and his symptoms were identified. He stated that he had not been suicidal prior to being arrested and his attempt at suicide was due to anxiety and embarrassment over being arrested. He was fearful of what would happen once he went to jail.  Medication management was initiated and he was encouraged to participate in unit programming. He was evaluated each day by a clinical provider. He was offered motivational support and education about his symptoms.  He attended groups and felt that they did help. He responded well to the supportive therapy and safe milieu.       Prior to his discharge with the provider and Tyler Lambert's consent he called the [facility] department to speak with the detective regarding what would happen next. The detective provided step by step information which Tyler Lambert felt relieved his anxiety.       On the day of his discharge he was in much improved condition and felt ready to be discharged. He was anxious as expected but stable and cooperative. He denied SI/HI or AVH. He was discharged to the [facility] and was given further information regarding follow up after he was bonded out of jail. Consults:  None  Significant Diagnostic Studies:  labs: CMP, UDS, UA, CBC, BAL  Discharge Vitals:   Blood pressure 118/82, pulse 89, temperature 97.5 F (36.4 C), temperature source Oral, resp. rate 16, height 5\' 8"  (1.727 m), weight 83.462 kg (184 lb). Body mass index is 27.98 kg/(m^2). Lab Results:   No results found for this or any previous visit (from the past 72 hour(s)).  Physical Findings: AIMS: Facial and Oral Movements Muscles of Facial Expression: None, normal Lips and Perioral  Area: None, normal Jaw: None, normal Tongue: None, normal,Extremity Movements Upper (arms, wrists, hands, fingers): None, normal Lower (legs, knees, ankles, toes): None, normal, Trunk Movements Neck, shoulders, hips: None, normal, Overall Severity Severity of abnormal movements (highest score from questions above): None, normal Incapacitation due to abnormal movements: None, normal Patient's awareness of abnormal movements (rate only patient's report): No Awareness, Dental Status Current problems with teeth and/or dentures?: No Does patient  usually wear dentures?: No  CIWA:  CIWA-Ar Total: 1 COWS:  COWS Total Score: 2  Psychiatric Specialty Exam: See Psychiatric Specialty Exam and Suicide Risk Assessment completed by Attending Physician prior to discharge.  Discharge destination:  Other:  Jail  Is patient on multiple antipsychotic therapies at discharge:  No   Has Patient had three or more failed trials of antipsychotic monotherapy by history:  No  Recommended Plan for Multiple Antipsychotic Therapies: NA  Discharge Orders   Future Orders Complete By Expires   Diet - low sodium heart healthy  As directed    Discharge instructions  As directed    Comments:     Take all of your medications as directed. Be sure to keep all of your follow up appointments.  If you are unable to keep your follow up appointment, call your Doctor's office to let them know, and reschedule.  Make sure that you have enough medication to last until your appointment. Be sure to get plenty of rest. Going to bed at the same time each night will help. Try to avoid sleeping during the day.  Increase your activity as tolerated. Regular exercise will help you to sleep better and improve your mental health. Eating a heart healthy diet is recommended. Try to avoid salty or fried foods. Be sure to avoid all alcohol and illegal drugs.   Increase activity slowly  As directed        Medication List    STOP taking these medications       amphetamine-dextroamphetamine 20 MG 24 hr capsule  Commonly known as:  ADDERALL XR     hydrOXYzine 50 MG capsule  Commonly known as:  VISTARIL     zolpidem 10 MG tablet  Commonly known as:  AMBIEN      TAKE these medications     Indication   ARIPiprazole 15 MG tablet  Commonly known as:  ABILIFY  Take 1 tablet (15 mg total) by mouth daily.   Indication:  Major Depressive Disorder     celecoxib 200 MG capsule  Commonly known as:  CELEBREX  Take 1 capsule (200 mg total) by mouth 2 (two) times daily.   Indication:   Joint Damage causing Pain and Loss of Function     clonazePAM 0.5 MG tablet  Commonly known as:  KLONOPIN  Take 1 tablet (0.5 mg total) by mouth 2 (two) times daily. For anxiety.   Indication:  Panic Disorder     hydrOXYzine 50 MG tablet  Commonly known as:  ATARAX/VISTARIL  Take 1 tablet (50 mg total) by mouth 3 (three) times daily. For anxiety.   Indication:  Tension     PARoxetine 40 MG tablet  Commonly known as:  PAXIL  Take 1 tablet (40 mg total) by mouth at bedtime. For depression.   Indication:  Generalized Anxiety Disorder, Major Depressive Disorder     traMADol 50 MG tablet  Commonly known as:  ULTRAM  Take 1 tablet (50 mg total) by mouth every 6 (six) hours as needed for pain.  traZODone 50 MG tablet  Commonly known as:  DESYREL  Take 1 tablet (50 mg total) by mouth at bedtime as needed for sleep. For insomnia.   Indication:  Trouble Sleeping           Follow-up Information   Follow up with Elroy Channel - [facility] On 07/16/2013. (Tuesday, July 16, 2013 at 10AM)    Contact information:   [address] [place], [place]   [address]  [phone]      Follow up with Dr. Jannifer Franklin - [facility] On 08/06/2013. (You are scheduled with Dr. Jannifer Franklin on Tuesday, August 06, 2013 at 12:00 [facility])    Contact information:   [address] [place], [place]   [address]  [phone]      Follow-up recommendations:   Activities: Resume activity as tolerated. Diet: Heart healthy low sodium diet Tests: Follow up testing will be determined by your out patient provider. Comments:   Continue to work on the life style changes that could help you better manage your mood/anxiety disorders:exercise, mindfulness Total Discharge Time:  Less than 30 minutes.  Signed: MASHBURN,NEIL 07/11/2013, 10:16 AM Agree with assessment and plan Reymundo Poll. Dub Mikes, M.D.

## 2013-07-15 ENCOUNTER — Emergency Department (HOSPITAL_COMMUNITY)
Admission: EM | Admit: 2013-07-15 | Discharge: 2013-07-15 | Disposition: A | Discharge status: Home / Self Care | Payer: Medicaid Other | Attending: Emergency Medicine | Admitting: Emergency Medicine

## 2013-07-15 ENCOUNTER — Encounter (HOSPITAL_COMMUNITY): Payer: Self-pay | Admitting: Emergency Medicine

## 2013-07-15 DIAGNOSIS — Z8719 Personal history of other diseases of the digestive system: Secondary | ICD-10-CM | POA: Insufficient documentation

## 2013-07-15 DIAGNOSIS — F4310 Post-traumatic stress disorder, unspecified: Secondary | ICD-10-CM | POA: Insufficient documentation

## 2013-07-15 DIAGNOSIS — F319 Bipolar disorder, unspecified: Secondary | ICD-10-CM | POA: Insufficient documentation

## 2013-07-15 DIAGNOSIS — F411 Generalized anxiety disorder: Secondary | ICD-10-CM | POA: Insufficient documentation

## 2013-07-15 DIAGNOSIS — L03113 Cellulitis of right upper limb: Secondary | ICD-10-CM

## 2013-07-15 DIAGNOSIS — G4700 Insomnia, unspecified: Secondary | ICD-10-CM | POA: Insufficient documentation

## 2013-07-15 DIAGNOSIS — Z79899 Other long term (current) drug therapy: Secondary | ICD-10-CM | POA: Insufficient documentation

## 2013-07-15 DIAGNOSIS — T8140XA Infection following a procedure, unspecified, initial encounter: Secondary | ICD-10-CM | POA: Insufficient documentation

## 2013-07-15 DIAGNOSIS — Y838 Other surgical procedures as the cause of abnormal reaction of the patient, or of later complication, without mention of misadventure at the time of the procedure: Secondary | ICD-10-CM | POA: Insufficient documentation

## 2013-07-15 DIAGNOSIS — IMO0002 Reserved for concepts with insufficient information to code with codable children: Secondary | ICD-10-CM | POA: Insufficient documentation

## 2013-07-15 MED ORDER — CEPHALEXIN 500 MG PO CAPS: 500.0000 mg | ORAL_CAPSULE | Freq: Four times a day (QID) | ORAL | Status: DC

## 2013-07-15 MED ORDER — TRAMADOL HCL 50 MG PO TABS: 50.0000 mg | ORAL_TABLET | Freq: Four times a day (QID) | ORAL | Status: DC | PRN

## 2013-07-15 MED ORDER — OXYCODONE-ACETAMINOPHEN 5-325 MG PO TABS
2.0000 | ORAL_TABLET | Freq: Once | ORAL | Status: AC
  Administered 2013-07-15: 2 via ORAL
  Filled 2013-07-15: qty 2

## 2013-07-15 MED ORDER — CEPHALEXIN 500 MG PO CAPS
500.0000 mg | ORAL_CAPSULE | Freq: Once | ORAL | Status: AC
  Administered 2013-07-15: 500 mg via ORAL
  Filled 2013-07-15: qty 1

## 2013-07-15 NOTE — ED Notes (Signed)
Pt has bilateral self inflicted cuts to wrist since 11/22. Pt reports taking out stitches out of wrists bilaterally. Pt left wrist approximated. Pt right wrist wound open and moist.

## 2013-07-15 NOTE — ED Provider Notes (Signed)
CSN: [id number]     Arrival date & time 07/15/13  2956 History   First MD Initiated Contact with Patient 07/15/13 0940     Chief Complaint  Patient presents with  . Wrist Injury   (Consider location/radiation/quality/duration/timing/severity/associated sxs/prior Treatment) HPI.... status post laceration repair to both wrists approximately 9 days ago for a self-inflicted injury.  Patient took the stitches out today.  Wound on right wrist spit open.  No fever, chills, pus.  Severity is moderate  Past Medical History  Diagnosis Date  . Anxiety   . Depression   . Acid reflux   . Irritable bowel syndrome   . PTSD (post-traumatic stress disorder)   . Bipolar 1 disorder   . ADHD (attention deficit hyperactivity disorder)   . Insomnia    Past Surgical History  Procedure Laterality Date  . Upper gastrointestinal endoscopy     Family History  Problem Relation Age of Onset  . Heart failure Father    History  Substance Use Topics  . Smoking status: Never Smoker   . Smokeless tobacco: Current User    Types: Snuff  . Alcohol Use: No    Review of Systems  All other systems reviewed and are negative.    Allergies  Review of patient's allergies indicates no known allergies.  Home Medications   Current Outpatient Rx  Name  Route  Sig  Dispense  Refill  . ARIPiprazole (ABILIFY) 15 MG tablet   Oral   Take 1 tablet (15 mg total) by mouth daily.   30 tablet   0   . clonazePAM (KLONOPIN) 0.5 MG tablet   Oral   Take 1 tablet (0.5 mg total) by mouth 2 (two) times daily. For anxiety.   60 tablet   0   . diphenhydramine-acetaminophen (TYLENOL PM) 25-500 MG TABS   Oral   Take 1 tablet by mouth at bedtime as needed.         . hydrOXYzine (ATARAX/VISTARIL) 50 MG tablet   Oral   Take 1 tablet (50 mg total) by mouth 3 (three) times daily. For anxiety.   90 tablet   0   . PARoxetine (PAXIL) 40 MG tablet   Oral   Take 1 tablet (40 mg total) by mouth at bedtime. For  depression.   30 tablet   0   . traMADol (ULTRAM) 50 MG tablet   Oral   Take 1 tablet (50 mg total) by mouth every 6 (six) hours as needed for pain.   20 tablet   0   . traZODone (DESYREL) 50 MG tablet   Oral   Take 1 tablet (50 mg total) by mouth at bedtime as needed for sleep. For insomnia.   30 tablet   0   . cephALEXin (KEFLEX) 500 MG capsule   Oral   Take 1 capsule (500 mg total) by mouth 4 (four) times daily.   20 capsule   0    BP 135/86  Pulse 110  Temp(Src) 98.1 F (36.7 C) (Oral)  Resp 18  SpO2 100% Physical Exam  Constitutional: He is oriented to person, place, and time. He appears well-developed and well-nourished.  HENT:  Head: Normocephalic.  Musculoskeletal: Normal range of motion.  Neurological: He is alert and oriented to person, place, and time.  Skin:  Right wrist:  Erythema surrounding the wound. No obvious pus. Good granulation tissue.  Wound is healing by secondary intent.  Left wrist: Slight erythema. No pus. Wound edges well approximated  Psychiatric:  Flat affect    ED Course  Procedures (including critical care time) Labs Review Labs Reviewed - No data to display Imaging Review No results found.  EKG Interpretation   None       MDM   1. Cellulitis of right upper extremity    Right wrist suspicious for minor cellulitis.  We will Steri-Strip wound and start Keflex.   Plan to heal by secondary intent     Donnetta Hutching, MD 07/15/13 1102

## 2013-07-16 NOTE — Progress Notes (Signed)
Patient Discharge Instructions:  After Visit Summary (AVS):   Faxed to:  07/16/13 Psychiatric Admission Assessment Note:   Faxed to:  07/16/13 Suicide Risk Assessment - Discharge Assessment:   Faxed to:  07/16/13 Faxed/Sent to the Next Level Care provider:  07/16/13 Faxed to [facility] @ [phone] Faxed to [facility] @ [phone]  Jerelene Redden, 07/16/2013, 3:02 PM

## 2013-11-13 ENCOUNTER — Encounter (HOSPITAL_COMMUNITY): Payer: Self-pay | Admitting: Emergency Medicine

## 2013-11-13 ENCOUNTER — Emergency Department (HOSPITAL_COMMUNITY)
Admission: EM | Admit: 2013-11-13 | Discharge: 2013-11-13 | Disposition: A | Discharge status: Home / Self Care | Payer: Medicaid Other | Attending: Emergency Medicine | Admitting: Emergency Medicine

## 2013-11-13 DIAGNOSIS — G4700 Insomnia, unspecified: Secondary | ICD-10-CM | POA: Insufficient documentation

## 2013-11-13 DIAGNOSIS — M545 Low back pain, unspecified: Secondary | ICD-10-CM | POA: Insufficient documentation

## 2013-11-13 DIAGNOSIS — G8929 Other chronic pain: Secondary | ICD-10-CM | POA: Insufficient documentation

## 2013-11-13 DIAGNOSIS — Z79899 Other long term (current) drug therapy: Secondary | ICD-10-CM | POA: Insufficient documentation

## 2013-11-13 DIAGNOSIS — F4310 Post-traumatic stress disorder, unspecified: Secondary | ICD-10-CM | POA: Insufficient documentation

## 2013-11-13 DIAGNOSIS — F411 Generalized anxiety disorder: Secondary | ICD-10-CM | POA: Insufficient documentation

## 2013-11-13 DIAGNOSIS — Z792 Long term (current) use of antibiotics: Secondary | ICD-10-CM | POA: Insufficient documentation

## 2013-11-13 DIAGNOSIS — M549 Dorsalgia, unspecified: Secondary | ICD-10-CM

## 2013-11-13 DIAGNOSIS — F319 Bipolar disorder, unspecified: Secondary | ICD-10-CM | POA: Insufficient documentation

## 2013-11-13 DIAGNOSIS — Z8719 Personal history of other diseases of the digestive system: Secondary | ICD-10-CM | POA: Insufficient documentation

## 2013-11-13 MED ORDER — KETOROLAC TROMETHAMINE 30 MG/ML IJ SOLN
30.0000 mg | Freq: Once | INTRAMUSCULAR | Status: AC
  Administered 2013-11-13: 30 mg via INTRAMUSCULAR
  Filled 2013-11-13: qty 1

## 2013-11-13 MED ORDER — KETOROLAC TROMETHAMINE 30 MG/ML IJ SOLN
30.0000 mg | Freq: Once | INTRAMUSCULAR | Status: DC
Start: 2013-11-13 — End: 2013-11-13

## 2013-11-13 MED ORDER — DIAZEPAM 5 MG PO TABS: 5.0000 mg | ORAL_TABLET | Freq: Two times a day (BID) | ORAL | Status: DC | PRN

## 2013-11-13 MED ORDER — DIAZEPAM 5 MG/ML IJ SOLN
5.0000 mg | Freq: Once | INTRAMUSCULAR | Status: AC
  Administered 2013-11-13: 5 mg via INTRAMUSCULAR
  Filled 2013-11-13: qty 2

## 2013-11-13 MED ORDER — DIAZEPAM 5 MG/ML IJ SOLN: 5.0000 mg | Freq: Once | INTRAMUSCULAR | Status: DC

## 2013-11-13 NOTE — ED Notes (Signed)
Pt has chronic back pain and arthritis.  Pain has gotten worse in last month.

## 2013-11-13 NOTE — ED Provider Notes (Signed)
Medical screening examination/treatment/procedure(s) were performed by non-physician practitioner and as supervising physician I was immediately available for consultation/collaboration.   EKG Interpretation None        Keyvon Herter M Chanceler Pullin, MD 11/13/13 1523 

## 2013-11-13 NOTE — ED Notes (Signed)
Per EDPA hold on d/c at this time until HR decreases.

## 2013-11-13 NOTE — Discharge Instructions (Signed)
Back Pain, Adult Low back pain is very common. About 1 in 5 people have back pain.The cause of low back pain is rarely dangerous. The pain often gets better over time.About half of people with a sudden onset of back pain feel better in just 2 weeks. About 8 in 10 people feel better by 6 weeks.  CAUSES Some common causes of back pain include:  Strain of the muscles or ligaments supporting the spine.  Wear and tear (degeneration) of the spinal discs.  Arthritis.  Direct injury to the back. DIAGNOSIS Most of the time, the direct cause of low back pain is not known.However, back pain can be treated effectively even when the exact cause of the pain is unknown.Answering your caregiver's questions about your overall health and symptoms is one of the most accurate ways to make sure the cause of your pain is not dangerous. If your caregiver needs more information, he or she may order lab work or imaging tests (X-rays or MRIs).However, even if imaging tests show changes in your back, this usually does not require surgery. HOME CARE INSTRUCTIONS For many people, back pain returns.Since low back pain is rarely dangerous, it is often a condition that people can learn to manageon their own.   Remain active. It is stressful on the back to sit or stand in one place. Do not sit, drive, or stand in one place for more than 30 minutes at a time. Take short walks on level surfaces as soon as pain allows.Try to increase the length of time you walk each day.  Do not stay in bed.Resting more than 1 or 2 days can delay your recovery.  Do not avoid exercise or work.Your body is made to move.It is not dangerous to be active, even though your back may hurt.Your back will likely heal faster if you return to being active before your pain is gone.  Pay attention to your body when you bend and lift. Many people have less discomfortwhen lifting if they bend their knees, keep the load close to their bodies,and  avoid twisting. Often, the most comfortable positions are those that put less stress on your recovering back.  Find a comfortable position to sleep. Use a firm mattress and lie on your side with your knees slightly bent. If you lie on your back, put a pillow under your knees.  Only take over-the-counter or prescription medicines as directed by your caregiver. Over-the-counter medicines to reduce pain and inflammation are often the most helpful.Your caregiver may prescribe muscle relaxant drugs.These medicines help dull your pain so you can more quickly return to your normal activities and healthy exercise.  Put ice on the injured area.  Put ice in a plastic bag.  Place a towel between your skin and the bag.  Leave the ice on for 15-20 minutes, 03-04 times a day for the first 2 to 3 days. After that, ice and heat may be alternated to reduce pain and spasms.  Ask your caregiver about trying back exercises and gentle massage. This may be of some benefit.  Avoid feeling anxious or stressed.Stress increases muscle tension and can worsen back pain.It is important to recognize when you are anxious or stressed and learn ways to manage it.Exercise is a great option. SEEK MEDICAL CARE IF:  You have pain that is not relieved with rest or medicine.  You have pain that does not improve in 1 week.  You have new symptoms.  You are generally not feeling well. SEEK   IMMEDIATE MEDICAL CARE IF:   You have pain that radiates from your back into your legs.  You develop new bowel or bladder control problems.  You have unusual weakness or numbness in your arms or legs.  You develop nausea or vomiting.  You develop abdominal pain.  You feel faint. Document Released: 08/01/2005 Document Revised: 01/31/2012 Document Reviewed: 12/20/2010 Exit[employer] Patient Information 2014 [employer].  

## 2013-11-13 NOTE — ED Provider Notes (Signed)
CSN: [id number]     Arrival date & time 11/13/13  1140 History   First MD Initiated Contact with Patient 11/13/13 1208     Chief Complaint  Patient presents with  . Back Pain     (Consider location/radiation/quality/duration/timing/severity/associated sxs/prior Treatment) HPI Comments: Patient presents to the emergency department with chief complaint of chronic low back pain. 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 He states that working aggravates his symptoms. States the pain is moderate to severe. The pain does not radiate. He states that he is tried Ultram, and OTC pain medicine with no relief. He denies any fevers, chills, IV drug use, history of cancer, bowel or bladder incontinence, saddle anesthesia, or weakness or numbness of the extremities.  The history is provided by the patient. No language interpreter was used.    Past Medical History  Diagnosis Date  . Anxiety   . Depression   . Acid reflux   . Irritable bowel syndrome   . PTSD (post-traumatic stress disorder)   . Bipolar 1 disorder   . ADHD (attention deficit hyperactivity disorder)   . Insomnia    Past Surgical History  Procedure Laterality Date  . Upper gastrointestinal endoscopy     Family History  Problem Relation Age of Onset  . Heart failure Father    History  Substance Use Topics  . Smoking status: Never Smoker   . Smokeless tobacco: Current User    Types: Snuff  . Alcohol Use: No    Review of Systems  Constitutional: Negative for fever and chills.  Gastrointestinal:       No bowel incontinence  Genitourinary:       No urinary incontinence  Musculoskeletal: Positive for arthralgias, back pain and myalgias.  Neurological:       No saddle anesthesia      Allergies  Review of patient's allergies indicates no known allergies.  Home Medications   Current Outpatient Rx  Name  Route  Sig  Dispense  Refill  . ARIPiprazole (ABILIFY) 15  MG tablet   Oral   Take 1 tablet (15 mg total) by mouth daily.   30 tablet   0   . cephALEXin (KEFLEX) 500 MG capsule   Oral   Take 1 capsule (500 mg total) by mouth 4 (four) times daily.   20 capsule   0   . clonazePAM (KLONOPIN) 0.5 MG tablet   Oral   Take 1 tablet (0.5 mg total) by mouth 2 (two) times daily. For anxiety.   60 tablet   0   . diphenhydramine-acetaminophen (TYLENOL PM) 25-500 MG TABS   Oral   Take 1 tablet by mouth at bedtime as needed.         . hydrOXYzine (ATARAX/VISTARIL) 50 MG tablet   Oral   Take 1 tablet (50 mg total) by mouth 3 (three) times daily. For anxiety.   90 tablet   0   . PARoxetine (PAXIL) 40 MG tablet   Oral   Take 1 tablet (40 mg total) by mouth at bedtime. For depression.   30 tablet   0   . traMADol (ULTRAM) 50 MG tablet   Oral   Take 1 tablet (50 mg total) by mouth every 6 (six) hours as needed for pain.   20 tablet   0   . traMADol (ULTRAM) 50 MG tablet   Oral   Take 1 tablet (50 mg total) by  mouth every 6 (six) hours as needed.   15 tablet   0   . traZODone (DESYREL) 50 MG tablet   Oral   Take 1 tablet (50 mg total) by mouth at bedtime as needed for sleep. For insomnia.   30 tablet   0    BP 124/76  Pulse 119  Temp(Src) 97.8 F (36.6 C) (Oral)  Resp 18  SpO2 98% Physical Exam  Nursing note and vitals reviewed. Constitutional: He is oriented to person, place, and time. He appears well-developed and well-nourished. No distress.  HENT:  Head: Normocephalic and atraumatic.  Eyes: Conjunctivae and EOM are normal. Right eye exhibits no discharge. Left eye exhibits no discharge. No scleral icterus.  Neck: Normal range of motion. Neck supple. No tracheal deviation present.  Cardiovascular: Normal rate, regular rhythm and normal heart sounds.  Exam reveals no gallop and no friction rub.   No murmur heard. Pulmonary/Chest: Effort normal and breath sounds normal. No respiratory distress. He has no wheezes.   Abdominal: Soft. He exhibits no distension. There is no tenderness.  Musculoskeletal: Normal range of motion.  Lumbar paraspinal muscles tender to palpation, no bony tenderness, step-offs, or gross abnormality or deformity of spine, patient is able to ambulate, moves all extremities  Bilateral great toe extension intact Bilateral plantar/dorsiflexion intact  Neurological: He is alert and oriented to person, place, and time. He has normal reflexes.  Sensation and strength intact bilaterally Symmetrical reflexes  Skin: Skin is warm. He is not diaphoretic.  Psychiatric: He has a normal mood and affect. His behavior is normal. Judgment and thought content normal.    ED Course  Procedures (including critical care time) Labs Review Labs Reviewed - No data to display Imaging Review No results found.   EKG Interpretation None      MDM   Final diagnoses:  Back pain    Patient with back pain.  No neurological deficits and normal neuro exam.  Patient is ambulatory.  No loss of bowel or bladder control.  Doubt cauda equina.  Denies fever,  doubt epidural abscess or other lesion. Recommend back exercises, stretching, RICE, and will treat with a short course of valium.  1:00 PM Patient reexamined. He states that he is feeling better. Heart rate is now 88.    Roxy Horsemanobert Shinika Estelle, PA-C 11/13/13 1301

## 2013-11-13 NOTE — ED Notes (Signed)
Initial Contact - pt reports c/o chronic back pain, worsening over the last month, ambulatory with steady gait, denies bowel/bladder changes or complaints.  Skin PWD.  Speaking full/clear sentences.  NAD.

## 2013-11-16 ENCOUNTER — Encounter (HOSPITAL_BASED_OUTPATIENT_CLINIC_OR_DEPARTMENT_OTHER): Payer: Self-pay | Admitting: Emergency Medicine

## 2013-11-16 ENCOUNTER — Emergency Department (HOSPITAL_BASED_OUTPATIENT_CLINIC_OR_DEPARTMENT_OTHER)
Admission: EM | Admit: 2013-11-16 | Discharge: 2013-11-16 | Disposition: A | Discharge status: Home / Self Care | Payer: Medicaid Other | Attending: Emergency Medicine | Admitting: Emergency Medicine

## 2013-11-16 DIAGNOSIS — Y939 Activity, unspecified: Secondary | ICD-10-CM | POA: Insufficient documentation

## 2013-11-16 DIAGNOSIS — G4700 Insomnia, unspecified: Secondary | ICD-10-CM | POA: Insufficient documentation

## 2013-11-16 DIAGNOSIS — S39012A Strain of muscle, fascia and tendon of lower back, initial encounter: Secondary | ICD-10-CM

## 2013-11-16 DIAGNOSIS — F4310 Post-traumatic stress disorder, unspecified: Secondary | ICD-10-CM | POA: Insufficient documentation

## 2013-11-16 DIAGNOSIS — S339XXA Sprain of unspecified parts of lumbar spine and pelvis, initial encounter: Secondary | ICD-10-CM | POA: Insufficient documentation

## 2013-11-16 DIAGNOSIS — F329 Major depressive disorder, single episode, unspecified: Secondary | ICD-10-CM | POA: Insufficient documentation

## 2013-11-16 DIAGNOSIS — S335XXA Sprain of ligaments of lumbar spine, initial encounter: Principal | ICD-10-CM

## 2013-11-16 DIAGNOSIS — F319 Bipolar disorder, unspecified: Secondary | ICD-10-CM | POA: Insufficient documentation

## 2013-11-16 DIAGNOSIS — Z79899 Other long term (current) drug therapy: Secondary | ICD-10-CM | POA: Insufficient documentation

## 2013-11-16 DIAGNOSIS — G8929 Other chronic pain: Secondary | ICD-10-CM | POA: Insufficient documentation

## 2013-11-16 DIAGNOSIS — X58XXXA Exposure to other specified factors, initial encounter: Secondary | ICD-10-CM | POA: Insufficient documentation

## 2013-11-16 DIAGNOSIS — F3289 Other specified depressive episodes: Secondary | ICD-10-CM | POA: Insufficient documentation

## 2013-11-16 DIAGNOSIS — F411 Generalized anxiety disorder: Secondary | ICD-10-CM | POA: Insufficient documentation

## 2013-11-16 DIAGNOSIS — Y929 Unspecified place or not applicable: Secondary | ICD-10-CM | POA: Insufficient documentation

## 2013-11-16 DIAGNOSIS — Z8719 Personal history of other diseases of the digestive system: Secondary | ICD-10-CM | POA: Insufficient documentation

## 2013-11-16 MED ORDER — HYDROCODONE-ACETAMINOPHEN 5-325 MG PO TABS: 1.0000 | ORAL_TABLET | Freq: Four times a day (QID) | ORAL | Status: DC | PRN

## 2013-11-16 MED ORDER — HYDROCODONE-ACETAMINOPHEN 5-325 MG PO TABS
2.0000 | ORAL_TABLET | Freq: Once | ORAL | Status: AC
  Administered 2013-11-16: 2 via ORAL
  Filled 2013-11-16: qty 2

## 2013-11-16 NOTE — Discharge Instructions (Signed)
Back Exercises °Back exercises help treat and prevent back injuries. The goal of back exercises is to increase the strength of your abdominal and back muscles and the flexibility of your back. These exercises should be started when you no longer have back pain. Back exercises include: °· Pelvic Tilt. Lie on your back with your knees bent. Tilt your pelvis until the lower part of your back is against the floor. Hold this position 5 to 10 sec and repeat 5 to 10 times. °· Knee to Chest. Pull first 1 knee up against your chest and hold for 20 to 30 seconds, repeat this with the other knee, and then both knees. This may be done with the other leg straight or bent, whichever feels better. °· Sit-Ups or Curl-Ups. Bend your knees 90 degrees. Start with tilting your pelvis, and do a partial, slow sit-up, lifting your trunk only 30 to 45 degrees off the floor. Take at least 2 to 3 seconds for each sit-up. Do not do sit-ups with your knees out straight. If partial sit-ups are difficult, simply do the above but with only tightening your abdominal muscles and holding it as directed. °· Hip-Lift. Lie on your back with your knees flexed 90 degrees. Push down with your feet and shoulders as you raise your hips a couple inches off the floor; hold for 10 seconds, repeat 5 to 10 times. °· Back arches. Lie on your stomach, propping yourself up on bent elbows. Slowly press on your hands, causing an arch in your low back. Repeat 3 to 5 times. Any initial stiffness and discomfort should lessen with repetition over time. °· Shoulder-Lifts. Lie face down with arms beside your body. Keep hips and torso pressed to floor as you slowly lift your head and shoulders off the floor. °Do not overdo your exercises, especially in the beginning. Exercises may cause you some mild back discomfort which lasts for a few minutes; however, if the pain is more severe, or lasts for more than 15 minutes, do not continue exercises until you see your caregiver.  Improvement with exercise therapy for back problems is slow.  °See your caregivers for assistance with developing a proper back exercise program. °Document Released: 09/08/2004 Document Revised: 10/24/2011 Document Reviewed: 06/02/2011 °ExitCare® Patient Information ©2014 [employer]. ° °Back Pain, Adult °Back pain is very common. The pain often gets better over time. The cause of back pain is usually not dangerous. Most people can learn to manage their back pain on their own.  °HOME CARE  °· Stay active. Start with short walks on flat ground if you can. Try to walk farther each day. °· Do not sit, drive, or stand in one place for more than 30 minutes. Do not stay in bed. °· Do not avoid exercise or work. Activity can help your back heal faster. °· Be careful when you bend or lift an object. Bend at your knees, keep the object close to you, and do not twist. °· Sleep on a firm mattress. Lie on your side, and bend your knees. If you lie on your back, put a pillow under your knees. °· Only take medicines as told by your doctor. °· Put ice on the injured area. °· Put ice in a plastic bag. °· Place a towel between your skin and the bag. °· Leave the ice on for 15-20 minutes, 03-04 times a day for the first 2 to 3 days. After that, you can switch between ice and heat packs. °· Ask your   doctor about back exercises or massage. °· Avoid feeling anxious or stressed. Find good ways to deal with stress, such as exercise. °GET HELP RIGHT AWAY IF:  °· Your pain does not go away with rest or medicine. °· Your pain does not go away in 1 week. °· You have new problems. °· You do not feel well. °· The pain spreads into your legs. °· You cannot control when you poop (bowel movement) or pee (urinate). °· Your arms or legs feel weak or lose feeling (numbness). °· You feel sick to your stomach (nauseous) or throw up (vomit). °· You have belly (abdominal) pain. °· You feel like you may pass out (faint). °MAKE SURE YOU:  °· Understand  these instructions. °· Will watch your condition. °· Will get help right away if you are not doing well or get worse. °Document Released: 01/18/2008 Document Revised: 10/24/2011 Document Reviewed: 12/20/2010 °ExitCare® Patient Information ©2014 [employer]. ° °

## 2013-11-16 NOTE — ED Provider Notes (Addendum)
CSN: [id number]     Arrival date & time 11/16/13  1009 History   First MD Initiated Contact with Patient 11/16/13 1100     Chief Complaint  Patient presents with  . Back Pain     (Consider location/radiation/quality/duration/timing/severity/associated sxs/prior Treatment) Patient is a 38 y.o. male presenting with back pain. The history is provided by the patient.  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 Anxiety   . Depression   . Acid reflux   . Irritable bowel syndrome   . PTSD (post-traumatic stress disorder)   . Bipolar 1 disorder   . ADHD (attention deficit hyperactivity disorder)   . Insomnia    Past Surgical History  Procedure Laterality Date  . Upper gastrointestinal endoscopy     Family History  Problem Relation Age of Onset  . Heart failure Father    History  Substance Use Topics  . Smoking status: Never Smoker   . Smokeless tobacco: Current User    Types: Snuff  . Alcohol Use: No    Review of Systems  Constitutional: Negative for fever.  Gastrointestinal: Negative for abdominal pain and bowel incontinence.  Genitourinary: Negative for bladder incontinence.  Musculoskeletal: Positive for back pain.  Neurological: Negative for tingling, weakness, numbness and paresthesias.    All other systems reviewed and are negative.      Allergies  Review of patient's allergies indicates no known allergies.  Home Medications   Current Outpatient Rx  Name  Route  Sig  Dispense  Refill  . ARIPiprazole (ABILIFY) 15 MG tablet   Oral   Take 1 tablet (15 mg total) by mouth daily.   30 tablet   0   . clonazePAM (KLONOPIN) 1 MG tablet   Oral   Take 1 mg by mouth 2 (two) times daily.         . diazepam (VALIUM) 5 MG tablet   Oral   Take 1 tablet (5 mg total) by mouth every 12 (twelve) hours as needed for anxiety.   10 tablet   0   . PARoxetine (PAXIL) 40 MG tablet   Oral   Take 1 tablet (40 mg total) by mouth at bedtime. For depression.   30 tablet   0   . traMADol (ULTRAM) 50 MG tablet   Oral   Take 1 tablet (50 mg total) by mouth every 6 (six) hours as needed for pain.   20 tablet   0    BP 133/85  Pulse 130  Temp(Src) 97.6 F (36.4 C) (Oral)  Resp 18  Ht 5\' 8"  (1.727 m)  Wt 195 lb (88.451 kg)  BMI 29.66 kg/m2  SpO2 100% Physical Exam  Nursing note and vitals reviewed. Constitutional: He is oriented to person, place, and time. He appears well-developed and well-nourished. He appears distressed.  HENT:  Head: Normocephalic and atraumatic.  Mouth/Throat: Oropharynx is clear and moist.  Eyes: EOM are normal. Pupils are equal, round, and reactive to light.  Neck: Normal range of motion. Neck supple. No spinous process tenderness and no muscular tenderness present. No rigidity. Normal range of motion present.  Cardiovascular: Normal rate and intact distal pulses.   Pulmonary/Chest: Effort normal.  Abdominal: There is no CVA tenderness.  Musculoskeletal: He exhibits no tenderness.       Lumbar back: He exhibits decreased range of motion and pain. He exhibits no bony tenderness, no swelling, no deformity and normal pulse.       Back:  Neurological: He is alert and oriented to person, place, and time. He has normal strength. No sensory deficit.  Coordination normal.  Reflex Scores:      Patellar reflexes are 2+ on the right side and 2+ on the left side. Skin: Skin is warm and dry. No rash noted.  Psychiatric: He has a normal mood and affect.    ED Course  Procedures (including critical care time) Labs Review Labs Reviewed - No data to display Imaging Review No results found.   EKG Interpretation None      MDM   Final diagnoses:  Lumbosacral strain    Pt with gradual onset of back pain suggestive muscloskeletal back pain.  No neurovascular compromise and no incontinence.  Pt has no infectious sx, hx of CA  or other red flags concerning for pathologic back pain.  Pt is able to ambulate but is painful.  Normal strength and reflexes on exam.  Denies trauma. Will give pt pain control and to return for developement of above sx.     Gwyneth SproutWhitney Arias Weinert, MD 11/16/13 1122  Gwyneth SproutWhitney Santosh Petter, MD 11/16/13 1123

## 2013-11-16 NOTE — ED Notes (Signed)
Pt has chronic back pain, worse in the last month.  No elimination problems.  Some numbness in hands.  Denies new injury.

## 2013-12-18 ENCOUNTER — Ambulatory Visit: Payer: Medicaid Other | Admitting: Physical Therapy

## 2014-01-15 ENCOUNTER — Ambulatory Visit: Payer: Medicaid Other | Attending: Family Medicine | Admitting: Physical Therapy

## 2014-01-29 ENCOUNTER — Emergency Department (HOSPITAL_COMMUNITY)
Admission: EM | Admit: 2014-01-29 | Discharge: 2014-01-29 | Disposition: A | Discharge status: Home / Self Care | Payer: Medicaid Other

## 2014-01-29 NOTE — ED Notes (Signed)
Patient checked in and immediately left.

## 2014-01-30 ENCOUNTER — Emergency Department (HOSPITAL_BASED_OUTPATIENT_CLINIC_OR_DEPARTMENT_OTHER)
Admission: EM | Admit: 2014-01-30 | Discharge: 2014-01-30 | Disposition: A | Discharge status: Home / Self Care | Payer: Medicaid Other | Attending: Emergency Medicine | Admitting: Emergency Medicine

## 2014-01-30 ENCOUNTER — Encounter (HOSPITAL_BASED_OUTPATIENT_CLINIC_OR_DEPARTMENT_OTHER): Payer: Self-pay | Admitting: Emergency Medicine

## 2014-01-30 DIAGNOSIS — M549 Dorsalgia, unspecified: Secondary | ICD-10-CM | POA: Insufficient documentation

## 2014-01-30 DIAGNOSIS — F3289 Other specified depressive episodes: Secondary | ICD-10-CM | POA: Insufficient documentation

## 2014-01-30 DIAGNOSIS — F329 Major depressive disorder, single episode, unspecified: Secondary | ICD-10-CM | POA: Insufficient documentation

## 2014-01-30 DIAGNOSIS — Z760 Encounter for issue of repeat prescription: Secondary | ICD-10-CM | POA: Insufficient documentation

## 2014-01-30 DIAGNOSIS — F411 Generalized anxiety disorder: Secondary | ICD-10-CM | POA: Insufficient documentation

## 2014-01-30 DIAGNOSIS — F4310 Post-traumatic stress disorder, unspecified: Secondary | ICD-10-CM | POA: Insufficient documentation

## 2014-01-30 DIAGNOSIS — K219 Gastro-esophageal reflux disease without esophagitis: Secondary | ICD-10-CM | POA: Insufficient documentation

## 2014-01-30 DIAGNOSIS — G8929 Other chronic pain: Secondary | ICD-10-CM | POA: Insufficient documentation

## 2014-01-30 LAB — URINALYSIS, ROUTINE W REFLEX MICROSCOPIC
Glucose, UA: NEGATIVE mg/dL
Hgb urine dipstick: NEGATIVE
Ketones, ur: NEGATIVE mg/dL
Leukocytes, UA: NEGATIVE
Nitrite: NEGATIVE
Protein, ur: NEGATIVE mg/dL
SPECIFIC GRAVITY, URINE: 1.027 (ref 1.005–1.030)
UROBILINOGEN UA: 0.2 mg/dL (ref 0.0–1.0)
pH: 5.5 (ref 5.0–8.0)

## 2014-01-30 MED ORDER — HYDROCODONE-ACETAMINOPHEN 5-325 MG PO TABS
2.0000 | ORAL_TABLET | Freq: Once | ORAL | Status: DC
Start: 2014-01-30 — End: 2014-01-30

## 2014-01-30 MED ORDER — NAPROXEN 500 MG PO TABS: 500.0000 mg | ORAL_TABLET | Freq: Two times a day (BID) | ORAL | Status: DC

## 2014-01-30 MED ORDER — NAPROXEN 250 MG PO TABS
500.0000 mg | ORAL_TABLET | Freq: Once | ORAL | Status: AC
  Administered 2014-01-30: 500 mg via ORAL
  Filled 2014-01-30: qty 2

## 2014-01-30 MED ORDER — PANTOPRAZOLE SODIUM 20 MG PO TBEC: 20.0000 mg | DELAYED_RELEASE_TABLET | Freq: Every day | ORAL | Status: DC

## 2014-01-30 NOTE — ED Notes (Addendum)
Lifting something and pulled his lower back a couple of days ago. C.o pain. States he is having trouble getting his medications refilled. He has been on medications since 2nd grade. Hx of ADD, PTSD, social anxiety, and insomnia. When asked about a ride he states his mother drove him and is waiting in the car. Told we will have to see her before he gets sedative medications. He then states he lied about how he got here. He drove himself.

## 2014-01-30 NOTE — ED Provider Notes (Signed)
CSN: [id number]     Arrival date & time 01/30/14  1159 History   First MD Initiated Contact with Patient 01/30/14 1220     Chief Complaint  Patient presents with  . Back Pain  . Medication Refill     (Consider location/radiation/quality/duration/timing/severity/associated sxs/prior Treatment) HPI Comments: Patient is a 38 year old male with a past medical history of chronic back pain, depression, anxiety, bipolar disorder, ADHD and PTSD who presents requesting medication refills for adderall, klonopin, Tramadol, Naprosyn, Pantoprazole, and Paxil. Patient reports not taking these medications for the past 8 months and "has not been able to function." Patient states he lays in bed most of the day and does not have a job. Patient believes that if he were able to get his medications, he would be able to "get his life together." Patient's complaint is aggravated chronic back pain. The pain is aching and severe and does not radiate. No known injury.   Patient is a 38 y.o. male presenting with back pain.  Back Pain Associated symptoms: no abdominal pain, no chest pain, no dysuria, no fever and no weakness     Past Medical History  Diagnosis Date  . Anxiety   . Depression   . Acid reflux   . Irritable bowel syndrome   . PTSD (post-traumatic stress disorder)   . Bipolar 1 disorder   . ADHD (attention deficit hyperactivity disorder)   . Insomnia    Past Surgical History  Procedure Laterality Date  . Upper gastrointestinal endoscopy     Family History  Problem Relation Age of Onset  . Heart failure Father    History  Substance Use Topics  . Smoking status: Never Smoker   . Smokeless tobacco: Current User    Types: Snuff  . Alcohol Use: No    Review of Systems  Constitutional: Negative for fever, chills and fatigue.  HENT: Negative for trouble swallowing.   Eyes: Negative for visual disturbance.  Respiratory: Negative for shortness of breath.   Cardiovascular: Negative for chest  pain and palpitations.  Gastrointestinal: Negative for nausea, vomiting, abdominal pain and diarrhea.  Genitourinary: Negative for dysuria and difficulty urinating.  Musculoskeletal: Positive for back pain. Negative for arthralgias and neck pain.  Skin: Negative for color change.  Neurological: Negative for dizziness and weakness.  Psychiatric/Behavioral: Negative for dysphoric mood.      Allergies  Review of patient's allergies indicates no known allergies.  Home Medications   Prior to Admission medications   Medication Sig Start Date End Date Taking? Authorizing Provider  ARIPiprazole (ABILIFY) 15 MG tablet Take 1 tablet (15 mg total) by mouth daily. 07/11/13   Verne SpurrNeil Mashburn, PA-C  clonazePAM (KLONOPIN) 1 MG tablet Take 1 mg by mouth 2 (two) times daily.    Historical Provider, MD  diazepam (VALIUM) 5 MG tablet Take 1 tablet (5 mg total) by mouth every 12 (twelve) hours as needed for anxiety. 11/13/13   Roxy Horsemanobert Browning, PA-C  HYDROcodone-acetaminophen (NORCO/VICODIN) 5-325 MG per tablet Take 1-2 tablets by mouth every 6 (six) hours as needed. 11/16/13   Gwyneth SproutWhitney Plunkett, MD  PARoxetine (PAXIL) 40 MG tablet Take 1 tablet (40 mg total) by mouth at bedtime. For depression. 07/11/13   Verne SpurrNeil Mashburn, PA-C  traMADol (ULTRAM) 50 MG tablet Take 1 tablet (50 mg total) by mouth every 6 (six) hours as needed for pain. 06/05/13   Jamesetta Orleanshristopher W Lawyer, PA-C   BP 124/89  Pulse 105  Temp(Src) 97.7 F (36.5 C) (Oral)  Resp 18  Ht 5\' 8"  (1.727 m)  Wt 200 lb (90.719 kg)  BMI 30.42 kg/m2  SpO2 99% Physical Exam  Nursing note and vitals reviewed. Constitutional: He is oriented to person, place, and time. He appears well-developed and well-nourished. No distress.  HENT:  Head: Normocephalic and atraumatic.  Eyes: Conjunctivae are normal.  Neck: Normal range of motion.  Cardiovascular: Normal rate and regular rhythm.  Exam reveals no gallop and no friction rub.   No murmur heard. Pulmonary/Chest:  Effort normal and breath sounds normal. He has no wheezes. He has no rales. He exhibits no tenderness.  Abdominal: Soft. There is no tenderness.  Musculoskeletal: Normal range of motion.  Neurological: He is alert and oriented to person, place, and time. Coordination normal.  Patient can ambulate without difficulty. Lower extremity strength and sensation equal and intact bilaterally. Speech is goal-oriented. Moves limbs without ataxia.   Skin: Skin is warm and dry.  Psychiatric: He has a normal mood and affect. His behavior is normal.    ED Course  Procedures (including critical care time) Labs Review Labs Reviewed  URINALYSIS, ROUTINE W REFLEX MICROSCOPIC - Abnormal; Notable for the following:    Color, Urine AMBER (*)    Bilirubin Urine SMALL (*)    All other components within normal limits    Imaging Review No results found.   EKG Interpretation None      MDM   Final diagnoses:  Medication refill  Chronic back pain    12:29 PM Patient requesting refills of Tramadol, Paxil, Klonopin, Pantoprazole, Naproxen and Adderal. I will not refill these medications due to patient not taking them in 8 months. Patient advised to follow up with his PCP for further evaluation and management of his medications. Vitals stable and patient afebrile.     Emilia BeckKaitlyn Szekalski, PA-C 01/30/14 1246

## 2014-01-30 NOTE — Discharge Instructions (Signed)
Take naprosyn as needed for pain. Take Protonix as directed. Refer to attached documents for more information. Follow up with your doctor for medication management.

## 2014-01-31 NOTE — ED Provider Notes (Signed)
Medical screening examination/treatment/procedure(s) were performed by non-physician practitioner and as supervising physician I was immediately available for consultation/collaboration.   Shanna CiscoMegan E Docherty, MD 01/31/14 [id number]

## 2014-10-01 ENCOUNTER — Encounter (HOSPITAL_COMMUNITY): Payer: Self-pay | Admitting: Family Medicine

## 2014-10-01 ENCOUNTER — Emergency Department (HOSPITAL_COMMUNITY)
Admission: EM | Admit: 2014-10-01 | Discharge: 2014-10-01 | Disposition: A | Discharge status: Home / Self Care | Payer: Medicaid Other | Attending: Emergency Medicine | Admitting: Emergency Medicine

## 2014-10-01 DIAGNOSIS — F419 Anxiety disorder, unspecified: Secondary | ICD-10-CM | POA: Insufficient documentation

## 2014-10-01 DIAGNOSIS — F3131 Bipolar disorder, current episode depressed, mild: Secondary | ICD-10-CM | POA: Diagnosis not present

## 2014-10-01 DIAGNOSIS — Z791 Long term (current) use of non-steroidal anti-inflammatories (NSAID): Secondary | ICD-10-CM | POA: Diagnosis not present

## 2014-10-01 DIAGNOSIS — Z79899 Other long term (current) drug therapy: Secondary | ICD-10-CM | POA: Diagnosis not present

## 2014-10-01 DIAGNOSIS — K219 Gastro-esophageal reflux disease without esophagitis: Secondary | ICD-10-CM | POA: Insufficient documentation

## 2014-10-01 DIAGNOSIS — G8929 Other chronic pain: Secondary | ICD-10-CM | POA: Diagnosis not present

## 2014-10-01 DIAGNOSIS — M545 Low back pain, unspecified: Secondary | ICD-10-CM

## 2014-10-01 DIAGNOSIS — F4310 Post-traumatic stress disorder, unspecified: Secondary | ICD-10-CM | POA: Insufficient documentation

## 2014-10-01 MED ORDER — HYDROMORPHONE HCL 1 MG/ML IJ SOLN
1.0000 mg | Freq: Once | INTRAMUSCULAR | Status: AC
  Administered 2014-10-01: 1 mg via INTRAMUSCULAR
  Filled 2014-10-01: qty 1

## 2014-10-01 MED ORDER — METHOCARBAMOL 500 MG PO TABS: 500.0000 mg | ORAL_TABLET | Freq: Two times a day (BID) | ORAL | Status: DC
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
## 2014-10-01 NOTE — Discharge Instructions (Signed)
Continue current pain medications. Make sure to continue to take ibuprofen for inflammation. Ty stretches, exercises. Follow up with primary care doctor.    Chronic Back Pain  When back pain lasts longer than 3 months, it is called chronic back pain.People with chronic back pain often go through certain periods that are more intense (flare-ups).  CAUSES Chronic back pain can be caused by wear and tear (degeneration) on different structures in your back. These structures include:  The bones of your spine (vertebrae) and the joints surrounding your spinal cord and nerve roots (facets).  The strong, fibrous tissues that connect your vertebrae (ligaments). Degeneration of these structures may result in pressure on your nerves. This can lead to constant pain. HOME CARE INSTRUCTIONS  Avoid bending, heavy lifting, prolonged sitting, and activities which make the problem worse.  Take brief periods of rest throughout the day to reduce your pain. Lying down or standing usually is better than sitting while you are resting.  Take over-the-counter or prescription medicines only as directed by your caregiver. SEEK IMMEDIATE MEDICAL CARE IF:   You have weakness or numbness in one of your legs or feet.  You have trouble controlling your bladder or bowels.  You have nausea, vomiting, abdominal pain, shortness of breath, or fainting. Document Released: 09/08/2004 Document Revised: 10/24/2011 Document Reviewed: 07/16/2011 [facility] Patient Information 2015 [place], [place]. This information is not intended to replace advice given to you by your health care provider. Make sure you discuss any questions you have with your health care provider.    Back Exercises These exercises may help you when beginning to rehabilitate your injury. Your symptoms may resolve with or without further involvement from your physician, physical therapist or athletic trainer. While completing these exercises, remember:    Restoring tissue flexibility helps normal motion to return to the joints. This allows healthier, less painful movement and activity.  An effective stretch should be held for at least 30 seconds.  A stretch should never be painful. You should only feel a gentle lengthening or release in the stretched tissue. STRETCH - Extension, Prone on Elbows   Lie on your stomach on the floor, a bed will be too soft. Place your palms about shoulder width apart and at the height of your head.  Place your elbows under your shoulders. If this is too painful, stack pillows under your chest.  Allow your body to relax so that your hips drop lower and make contact more completely with the floor.  Hold this position for __________ seconds.  Slowly return to lying flat on the floor. Repeat __________ times. Complete this exercise __________ times per day.  RANGE OF MOTION - Extension, Prone Press Ups   Lie on your stomach on the floor, a bed will be too soft. Place your palms about shoulder width apart and at the height of your head.  Keeping your back as relaxed as possible, slowly straighten your elbows while keeping your hips on the floor. You may adjust the placement of your hands to maximize your comfort. As you gain motion, your hands will come more underneath your shoulders.  Hold this position __________ seconds.  Slowly return to lying flat on the floor. Repeat __________ times. Complete this exercise __________ times per day.  RANGE OF MOTION- Quadruped, Neutral Spine   Assume a hands and knees position on a firm surface. Keep your hands under your shoulders and your knees under your hips. You may place padding under your knees for comfort.  Drop your head and point your tail bone toward the ground below you. This will round out your low back like an angry cat. Hold this position for __________ seconds.  Slowly lift your head and release your tail bone so that your back sags into a large arch,  like an old horse.  Hold this position for __________ seconds.  Repeat this until you feel limber in your low back.  Now, find your "sweet spot." This will be the most comfortable position somewhere between the two previous positions. This is your neutral spine. Once you have found this position, tense your stomach muscles to support your low back.  Hold this position for __________ seconds. Repeat __________ times. Complete this exercise __________ times per day.  STRETCH - Flexion, Single Knee to Chest   Lie on a firm bed or floor with both legs extended in front of you.  Keeping one leg in contact with the floor, bring your opposite knee to your chest. Hold your leg in place by either grabbing behind your thigh or at your knee.  Pull until you feel a gentle stretch in your low back. Hold __________ seconds.  Slowly release your grasp and repeat the exercise with the opposite side. Repeat __________ times. Complete this exercise __________ times per day.  STRETCH - Hamstrings, Standing  Stand or sit and extend your right / left leg, placing your foot on a chair or foot stool  Keeping a slight arch in your low back and your hips straight forward.  Lead with your chest and lean forward at the waist until you feel a gentle stretch in the back of your right / left knee or thigh. (When done correctly, this exercise requires leaning only a small distance.)  Hold this position for __________ seconds. Repeat __________ times. Complete this stretch __________ times per day. STRENGTHENING - Deep Abdominals, Pelvic Tilt   Lie on a firm bed or floor. Keeping your legs in front of you, bend your knees so they are both pointed toward the ceiling and your feet are flat on the floor.  Tense your lower abdominal muscles to press your low back into the floor. This motion will rotate your pelvis so that your tail bone is scooping upwards rather than pointing at your feet or into the floor.  With a  gentle tension and even breathing, hold this position for __________ seconds. Repeat __________ times. Complete this exercise __________ times per day.  STRENGTHENING - Abdominals, Crunches   Lie on a firm bed or floor. Keeping your legs in front of you, bend your knees so they are both pointed toward the ceiling and your feet are flat on the floor. Cross your arms over your chest.  Slightly tip your chin down without bending your neck.  Tense your abdominals and slowly lift your trunk high enough to just clear your shoulder blades. Lifting higher can put excessive stress on the low back and does not further strengthen your abdominal muscles.  Control your return to the starting position. Repeat __________ times. Complete this exercise __________ times per day.  STRENGTHENING - Quadruped, Opposite UE/LE Lift   Assume a hands and knees position on a firm surface. Keep your hands under your shoulders and your knees under your hips. You may place padding under your knees for comfort.  Find your neutral spine and gently tense your abdominal muscles so that you can maintain this position. Your shoulders and hips should form a rectangle that is parallel with  the floor and is not twisted.  Keeping your trunk steady, lift your right hand no higher than your shoulder and then your left leg no higher than your hip. Make sure you are not holding your breath. Hold this position __________ seconds.  Continuing to keep your abdominal muscles tense and your back steady, slowly return to your starting position. Repeat with the opposite arm and leg. Repeat __________ times. Complete this exercise __________ times per day. Document Released: 08/19/2005 Document Revised: 10/24/2011 Document Reviewed: 11/13/2008 [facility] Patient Information 2015 [place], [place]. This information is not intended to replace advice given to you by your health care provider. Make sure you discuss any questions you have with your  health care provider.   Emergency Department Resource Guide 1) Find a Doctor and Pay Out of Pocket Although you won't have to find out who is covered by your insurance plan, it is a good idea to ask around and get recommendations. You will then need to call the office and see if the doctor you have chosen will accept you as a new patient and what types of options they offer for patients who are self-pay. Some doctors offer discounts or will set up payment plans for their patients who do not have insurance, but you will need to ask so you aren't surprised when you get to your appointment.  2) Contact Your Local Health Department Not all health departments have doctors that can see patients for sick visits, but many do, so it is worth a call to see if yours does. If you don't know where your local health department is, you can check in your phone book. The CDC also has a tool to help you locate your state's health department, and many state websites also have listings of all of their local health departments.  3) Find a Walk-in Clinic If your illness is not likely to be very severe or complicated, you may want to try a walk in clinic. These are popping up all over the country in pharmacies, drugstores, and shopping centers. They're usually staffed by nurse practitioners or physician assistants that have been trained to treat common illnesses and complaints. They're usually fairly quick and inexpensive. However, if you have serious medical issues or chronic medical problems, these are probably not your best option.  No Primary Care Doctor: - Call Health Connect at  [phone] - they can help you locate a primary care doctor that  accepts your insurance, provides certain services, etc. - Physician Referral Service- [phone]  Chronic Pain Problems: Organization         Address  Phone   Notes  [facility]  [phone] Patients need to be referred by their primary care doctor.    Medication Assistance: Organization         Address  Phone   Notes  [facility] Medication [facility] [address] [place] [place], [place] [address] [phone] --Must be a resident of [facility] -- Must have NO insurance coverage whatsoever (no Medicaid/ Medicare, etc.) -- The pt. MUST have a primary care doctor that directs their care regularly and follows them in the community   [facility]  [phone]   [employer]  [phone]    Agencies that provide inexpensive medical care: Organization         Address  Phone   Notes  [facility]  [phone]   [facility]    [phone]  [facility] [address] [place], [place] [address] [phone]) [phone]   [facility] [place] [address], [place] [phone]) [phone]   Planned Parenthood    [phone](336) [phone]   [facility]    [phone]   [facility] and [facility]  [address], [place] Phone:  [phone]) [phone], Fax:  [phone] Hours of Operation:  9 am - 6 pm, M-F.  Also accepts Medicaid/Medicare and self-pay.  [facility]  [address], [place] Phone: [phone](336) [phone], Fax: [phone](336) [phone]. Hours of Operation:  8:30 am - 5:30 pm, M-F.  Also accepts Medicaid and self-pay.  [facility] [place] [address], [place] Phone: [phone](336) [phone]   [facility] [address], [place], [place] [phone], Ext. [phone] Mondays & Thursdays: 7-9 AM.  First 15 patients are seen on a first come, first serve basis.    Medicaid-accepting [facility] Providers:  Organization         Address  Phone   Notes  [facility] [address], [place] [phone]) [phone] Also accepts self-pay patients.  [facility] [address] [place], [place]  [phone]) [phone]   [facility] [address], [place] [phone]) [phone]   [facility] [address] [place] [address], [place] [phone]) [phone]   Renaye RakersVeita Bland [address], [place]   [phone] Only accepts [place] Access [place] patients after they have their name applied to their card.   Self-Pay (no insurance) in [facility]:  Organization         Address  Phone   Notes  Sickle Cell Patients, [facility] [address], [place] [phone]) [phone]   [facility] [address], [place] [phone]) [phone]   [facility] [place]  [address], [place] [phone]) [phone]   [facility]/Dr. Osei-Bonsu  [address] [place] [address], [place] or [phone], [place] [phone]) [phone] Phone number for both [place] and [place] locations is the same.  [facility] [address], [place] [phone]) [phone]   [facility]  [address] [place] [address], [place] or [address] [phone]) [phone]   [facility] [address], [place] [phone]) [phone], phone; [phone]) [phone], fax Sees patients 1st and 3rd Saturday of every month.  Must not qualify for public or private insurance (i.e. Medicaid, Medicare, [place] Health Choice, Veterans' Benefits)  Household income should be no more than 200% of the poverty level The clinic cannot treat you if you are pregnant or think you are pregnant  Sexually transmitted diseases are not treated at the clinic.    Dental Care: Organization         Address  Phone  Notes  [facility] [address], [place] [phone]) [phone] Accepts children up to age 40 who are enrolled in [place] or [place] Health Choice; pregnant women with a Medicaid card; and children who have applied for Medicaid or [place] Health Choice, but were declined, whose parents can pay a reduced fee at time of service.    [facility] [place]  [address], [place] [phone]) [phone] Accepts children up to age 40  who are enrolled in Medicaid or [place] Health Choice; pregnant women with a Medicaid card; and children who have applied for Medicaid or [place] Health Choice, but were declined, whose parents can pay a reduced fee at time of service.  [facility]  [address] [place], [place] [phone] Patients are seen by appointment only. Walk-ins are not accepted. [facility] will see patients 61 years of age and older. Monday - Tuesday (8am-5pm) Most Wednesdays (8:30-5pm) $30 per visit, cash only  [facility] Adult Dental Access PRO[facility]  [address], [facility] [phone] Patients are seen by appointment only. Walk-ins are not accepted. [facility] will see patients 22 years of age and older. One Wednesday Evening (Monthly: Volunteer Based).  $30 per visit, cash only  [employer]  [phone] for adults; Children under age 45, call Graduate Pediatric Dentistry at [phone]. Children aged 87-14, please call [phone] to request a pediatric application.  Dental services are provided in all areas of dental care including fillings, crowns and bridges, complete and partial dentures, implants, gum treatment, root canals, and extractions. Preventive care is also provided. Treatment is provided to both adults and children. Patients are selected via a lottery and there is often a waiting list.   [facility] [address], [place]  [phone] [email or web address]   [facility] [address] [place], [place] [phone], Ext. [phone] Second and Fourth Thursday of each month, opens at 6:30 AM; Clinic ends at 9 AM.  Patients are seen on a first-come first-served basis, and a limited number are seen during each clinic.   [facility]  [address] [place], [place] [phone]   Eligibility Requirements You must have lived in [place], [place], or [place] counties for at least the last three months.   You cannot be eligible for state or federal sponsored [place], including [employer], [place], or [employer].   You generally cannot be eligible for healthcare insurance through your employer.    How to apply: Eligibility screenings are held every Tuesday and Wednesday afternoon from 1:00 pm until 4:00 pm. You do not need an appointment for the interview!  [facility] [address], [place], [place] [phone]   [facility] Health Department  [phone]   [facility] Health Department  [phone]   [facility] Health Department  [phone]    Behavioral Health Resources in the Community: Intensive Outpatient Programs Organization         Address  Phone  Notes  [facility] [address], [place], [place] [phone]   [facility] [address], [place], [place] [phone]   [facility] [address] [place], [place]  [phone]   [facility] Mental Health [address]  [place], [place] [phone] or [phone]   Substance Abuse Resources Organization         Address  Phone  Notes  [facility] Drug Services  [phone]   [facility]  [phone]   [address]  [phone]   Floydene Flock  [phone]   Residential & Outpatient Substance Abuse Program  [phone]   Psychological Services Organization         Address  Phone  Notes  [facility]  [phone]   [facility] Services  [phone]   [facility] Mental Health [address],  [place] [phone] or [phone]    Mobile Crisis Teams Organization         Address  Phone  Notes  [facility], Mobile Crisis Care Unit  [phone]   [facility]  [address] [place], [place] [phone]   Doristine Locks [address] [place] [phone]    Self-Help/Support Groups Organization         Address  Phone             Notes  [facility] [place] - variety of support groups  [phone]- I7437963 Call for more information  Narcotics Anonymous (NA), Caring Services [address], [employer] [place]  2 meetings at this location   Statistician         Address  Phone  Notes  [facility] [address],    [place]  [phone]   [facility]  [address] [place] [address], [place], [place] [phone]   [facility] [address] [place], [place] [phone] Admissions: 8am-3pm M-F  [facility] [address]    [place], [place] [phone]   [facility] [address] [place], [place], [place] [phone]   [facility] [address],  [place], [place] [phone]   [facility] - Intensive Outpatient [address], [place], [place] [phone]   [facility] ([facility] [address]  [place], [place] [phone] or [phone]   [facility] ([facility]) [address], [place], [place] [phone] Accepts Medicaid  [facility] [address],  [place] [phone] Substance Abuse/Addiction Treatment   [facility] Organization         Address  Phone  Notes  [facility]  [phone]   Angie Fava, PhD [address] Kn[address] [place], [place]   [phone] or 7012319234   [facility] Behavioral   [address] [place], [place] [phone]   [facility] [address] [place], [place] [phone] Insurance/Medicaid/sponsorship through [facility] [address]                                    [place], [place] [phone]  Therapy/tele-psych/case  [facility] [address], [place] [phone]    Dr. Lolly Mustache  [phone]   [facility]  [employer] [facility] Dept. 1) [address] [place] 2) [address] [place] 3)  [address], [place] [phone]  [phone]   [facility] Child Abuse Hotline 775-845-8263 or 760-435-0941 (After Hours)

## 2014-10-01 NOTE — ED Notes (Signed)
Pt here with chronic lower back pain. sts his doctor is not helping him.

## 2014-10-01 NOTE — ED Notes (Signed)
Pt states he is not " pill seekin, he just wants help". " PT states primary care MD prescribes (2) 5mg  Vicodin.

## 2014-10-29 ENCOUNTER — Emergency Department (HOSPITAL_COMMUNITY)
Admission: EM | Admit: 2014-10-29 | Discharge: 2014-10-29 | Discharge status: Absent without leave | Payer: Medicaid Other | Source: Home / Self Care | Attending: Family Medicine | Admitting: Family Medicine

## 2014-10-29 NOTE — ED Notes (Signed)
Called x1... n/a 

## 2014-10-29 NOTE — ED Notes (Signed)
Called x2; n/a... Notified by front staff pt left

## 2014-10-31 ENCOUNTER — Encounter (HOSPITAL_COMMUNITY): Payer: Self-pay

## 2014-10-31 ENCOUNTER — Emergency Department (HOSPITAL_COMMUNITY)
Admission: EM | Admit: 2014-10-31 | Discharge: 2014-11-01 | Disposition: A | Discharge status: Other Acute Inpatient Hospital | Payer: Medicaid Other | Source: Home / Self Care | Attending: Emergency Medicine | Admitting: Emergency Medicine

## 2014-10-31 DIAGNOSIS — Z791 Long term (current) use of non-steroidal anti-inflammatories (NSAID): Secondary | ICD-10-CM | POA: Insufficient documentation

## 2014-10-31 DIAGNOSIS — Z79899 Other long term (current) drug therapy: Secondary | ICD-10-CM

## 2014-10-31 DIAGNOSIS — Z8669 Personal history of other diseases of the nervous system and sense organs: Secondary | ICD-10-CM | POA: Insufficient documentation

## 2014-10-31 DIAGNOSIS — K219 Gastro-esophageal reflux disease without esophagitis: Secondary | ICD-10-CM | POA: Insufficient documentation

## 2014-10-31 DIAGNOSIS — F32A Depression, unspecified: Secondary | ICD-10-CM

## 2014-10-31 DIAGNOSIS — R45851 Suicidal ideations: Secondary | ICD-10-CM | POA: Insufficient documentation

## 2014-10-31 DIAGNOSIS — F329 Major depressive disorder, single episode, unspecified: Secondary | ICD-10-CM

## 2014-10-31 LAB — COMPREHENSIVE METABOLIC PANEL
ALK PHOS: 49 U/L (ref 39–117)
ALT: 23 U/L (ref 0–53)
AST: 21 U/L (ref 0–37)
Albumin: 4.5 g/dL (ref 3.5–5.2)
Anion gap: 9 (ref 5–15)
BUN: 13 mg/dL (ref 6–23)
CALCIUM: 9.5 mg/dL (ref 8.4–10.5)
CO2: 28 mmol/L (ref 19–32)
Chloride: 102 mmol/L (ref 96–112)
Creatinine, Ser: 1.05 mg/dL (ref 0.50–1.35)
GFR calc Af Amer: >90 mL/min (ref >90)
GFR, EST NON AFRICAN AMERICAN: 88 mL/min — AB (ref >90)
Glucose, Bld: 93 mg/dL (ref 70–99)
Potassium: 3.6 mmol/L (ref 3.5–5.1)
SODIUM: 139 mmol/L (ref 135–145)
Total Bilirubin: 0.9 mg/dL (ref 0.3–1.2)
Total Protein: 7.4 g/dL (ref 6.0–8.3)

## 2014-10-31 LAB — RAPID URINE DRUG SCREEN, HOSP PERFORMED
AMPHETAMINES: NOT DETECTED
Barbiturates: NOT DETECTED
Benzodiazepines: POSITIVE — AB
Cocaine: POSITIVE — AB
OPIATES: NOT DETECTED
Tetrahydrocannabinol: NOT DETECTED

## 2014-10-31 LAB — CBC
HCT: 37.8 % — ABNORMAL LOW (ref 39.0–52.0)
Hemoglobin: 13.2 g/dL (ref 13.0–17.0)
MCH: 29.5 pg (ref 26.0–34.0)
MCHC: 34.9 g/dL (ref 30.0–36.0)
MCV: 84.6 fL (ref 78.0–100.0)
Platelets: 330 10*3/uL (ref 150–400)
RBC: 4.47 MIL/uL (ref 4.22–5.81)
RDW: 13.6 % (ref 11.5–15.5)
WBC: 11.6 10*3/uL — ABNORMAL HIGH (ref 4.0–10.5)

## 2014-10-31 LAB — ETHANOL

## 2014-10-31 MED ORDER — NICOTINE 21 MG/24HR TD PT24: 21.0000 mg | MEDICATED_PATCH | Freq: Every day | TRANSDERMAL | Status: DC

## 2014-10-31 MED ORDER — ONDANSETRON HCL 4 MG PO TABS: 4.0000 mg | ORAL_TABLET | Freq: Three times a day (TID) | ORAL | Status: DC | PRN

## 2014-10-31 MED ORDER — LORAZEPAM 1 MG PO TABS
1.0000 mg | ORAL_TABLET | Freq: Three times a day (TID) | ORAL | Status: DC | PRN
  Administered 2014-11-01: 1 mg via ORAL
  Filled 2014-10-31: qty 1

## 2014-10-31 MED ORDER — ALUM & MAG HYDROXIDE-SIMETH 200-200-20 MG/5ML PO SUSP: 30.0000 mL | ORAL | Status: DC | PRN

## 2014-10-31 MED ORDER — IBUPROFEN 200 MG PO TABS: 600.0000 mg | ORAL_TABLET | Freq: Three times a day (TID) | ORAL | Status: DC | PRN

## 2014-10-31 MED ORDER — ZOLPIDEM TARTRATE 5 MG PO TABS: 5.0000 mg | ORAL_TABLET | Freq: Every evening | ORAL | Status: DC | PRN

## 2014-10-31 NOTE — ED Notes (Signed)
Pt here with mom.  States he doesn't want to do this anymore.  Lives with mom.  Social disorder.  Applying for disability.  Pt has plan to hurt self.  Go to woods and take meds and go to sleep.  Pt denies HI or attempt today.

## 2014-10-31 NOTE — BH Assessment (Signed)
Assessment completed. Consulted Hulan FessIjeoma Nwaeze, NP who agrees that pt meets inpatient criteria. Robyn Hess, PA-C has been informed of the recommendation.
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
## 2014-10-31 NOTE — ED Notes (Signed)
Pt presents with SI, plan to go into woods, take an overdose of pills and go to sleep.  Admits to feeling SI, denies HI or AV  Hallucinations.  Positive SI attempt 05/2013 by cutting both wrists.  Pt reports diagnosed with Bipolar DO, Social Anxiety DO, ADD, Depression.  Pt states he is feeling hopeless.  Pt calm & cooperative at present, AAO x 3, no distress noted,  Will monitor for safety, q 15 min checks in place.  [facility]

## 2014-10-31 NOTE — BH Assessment (Signed)
[facility] to the services of Dr. Jama Flavorsobos. Celene Skeenobyn Hess, PA-C and Hansel StarlingLatricia London, RN has been informed of the disposition.

## 2014-10-31 NOTE — ED Provider Notes (Signed)
CSN: [id number]     Arrival date & time 10/31/14  1812 History   First MD Initiated Contact with Patient 10/31/14 1841     Chief Complaint  Patient presents with  . Suicidal     (Consider location/radiation/quality/duration/timing/severity/associated sxs/prior Treatment) HPI Comments: 39 year old male presenting with worsening depression, suicidal ideations and plan. States he has been very depressed for a long time and "does not want to do this anymore". States he wants to go out into the woods where there is a large, open area, take multiple pills, go to sleep and never wake up. He has not been taking his antidepressant medications as they are not working. He lives at home with mom, states his living situation is not ideal. Denies homicidal ideations. He is a smoker. Denies alcohol use. Admits to prior history of alcohol abuse.  The history is provided by the patient.    Past Medical History  Diagnosis Date  . Anxiety   . Depression   . Acid reflux   . Irritable bowel syndrome   . PTSD (post-traumatic stress disorder)   . Bipolar 1 disorder   . ADHD (attention deficit hyperactivity disorder)   . Insomnia    Past Surgical History  Procedure Laterality Date  . Upper gastrointestinal endoscopy     Family History  Problem Relation Age of Onset  . Heart failure Father    History  Substance Use Topics  . Smoking status: Never Smoker   . Smokeless tobacco: Current User    Types: Snuff  . Alcohol Use: No    Review of Systems  Psychiatric/Behavioral: Positive for suicidal ideas and dysphoric mood.  All other systems reviewed and are negative.     Allergies  Review of patient's allergies indicates no known allergies.  Home Medications   Prior to Admission medications   Medication Sig Start Date End Date Taking? Authorizing Provider  ALPRAZolam Prudy Feeler(XANAX) 1 MG tablet Take 1 mg by mouth 3 (three) times daily.   Yes Historical Provider, MD  diphenhydramine-acetaminophen  (TYLENOL PM) 25-500 MG TABS Take 4 tablets by mouth at bedtime as needed (sleep).   Yes Historical Provider, MD  DOXEPIN HCL PO Take 1 capsule by mouth at bedtime.   Yes Historical Provider, MD  HYDROcodone-acetaminophen (NORCO/VICODIN) 5-325 MG per tablet Take 1-2 tablets by mouth every 6 (six) hours as needed. 11/16/13  Yes Gwyneth SproutWhitney Plunkett, MD  traMADol (ULTRAM) 50 MG tablet Take 1 tablet (50 mg total) by mouth every 6 (six) hours as needed for pain. 06/05/13  Yes Christopher Lawyer, PA-C  zolpidem (AMBIEN) 10 MG tablet Take 10 mg by mouth at bedtime as needed for sleep (sleep).   Yes Historical Provider, MD  ARIPiprazole (ABILIFY) 15 MG tablet Take 1 tablet (15 mg total) by mouth daily. Patient not taking: Reported on 10/31/2014 07/11/13   Tamala JulianNeil T Mashburn, PA-C  clonazePAM (KLONOPIN) 1 MG tablet Take 1 mg by mouth 2 (two) times daily as needed for anxiety.     Historical Provider, MD  diazepam (VALIUM) 5 MG tablet Take 1 tablet (5 mg total) by mouth every 12 (twelve) hours as needed for anxiety. Patient not taking: Reported on 10/31/2014 11/13/13   Roxy Horsemanobert Browning, PA-C  methocarbamol (ROBAXIN) 500 MG tablet Take 1 tablet (500 mg total) by mouth 2 (two) times daily. Patient not taking: Reported on 10/31/2014 10/01/14   Tatyana Kirichenko, PA-C  naproxen (NAPROSYN) 500 MG tablet Take 1 tablet (500 mg total) by mouth 2 (two) times daily with a  meal. Patient not taking: Reported on 10/31/2014 01/30/14   Emilia Beck, PA-C  pantoprazole (PROTONIX) 20 MG tablet Take 1 tablet (20 mg total) by mouth daily. Patient not taking: Reported on 10/31/2014 01/30/14   Emilia Beck, PA-C  PARoxetine (PAXIL) 40 MG tablet Take 1 tablet (40 mg total) by mouth at bedtime. For depression. Patient not taking: Reported on 10/31/2014 07/11/13   Lloyd Huger T Mashburn, PA-C   BP 104/65 mmHg  Pulse 78  Temp(Src) 98.4 F (36.9 C)  Resp 18  SpO2 100% Physical Exam  Constitutional: He is oriented to person, place, and time.  He appears well-developed and well-nourished. No distress.  HENT:  Head: Normocephalic and atraumatic.  Eyes: Conjunctivae and EOM are normal.  Neck: Normal range of motion. Neck supple.  Cardiovascular: Normal rate, regular rhythm and normal heart sounds.   Pulmonary/Chest: Effort normal and breath sounds normal.  Musculoskeletal: Normal range of motion. He exhibits no edema.  Neurological: He is alert and oriented to person, place, and time.  Skin: Skin is warm and dry.  Psychiatric: His behavior is normal. He exhibits a depressed mood. He expresses suicidal ideation. He expresses no homicidal ideation. He expresses suicidal plans.  Nursing note and vitals reviewed.   ED Course  Procedures (including critical care time) Labs Review Labs Reviewed  CBC - Abnormal; Notable for the following:    WBC 11.6 (*)    HCT 37.8 (*)    All other components within normal limits  URINE RAPID DRUG SCREEN (HOSP PERFORMED) - Abnormal; Notable for the following:    Cocaine POSITIVE (*)    Benzodiazepines POSITIVE (*)    All other components within normal limits  COMPREHENSIVE METABOLIC PANEL  ETHANOL  SALICYLATE LEVEL  ACETAMINOPHEN LEVEL    Imaging Review No results found.   EKG Interpretation None      MDM   Final diagnoses:  Suicidal ideation  Depression   Patient with suicidal ideation and plan. Labs pending. TTS consult complete, recommend inpatient treatment. Awaiting placement.  Pt accepted to [facility], accepting physician Dr. Jama Flavors.  Kathrynn Speed, PA-C 10/31/14 2104  Glynn Octave, MD 10/31/14 [phone]

## 2014-11-01 ENCOUNTER — Encounter (HOSPITAL_COMMUNITY): Payer: Self-pay

## 2014-11-01 ENCOUNTER — Inpatient Hospital Stay (HOSPITAL_COMMUNITY)
Admission: AD | Admit: 2014-11-01 | Discharge: 2014-11-06 | DRG: 885 | Disposition: A | Discharge status: Home / Self Care | Payer: Medicaid Other | Source: Intra-hospital | Attending: Psychiatry | Admitting: Psychiatry

## 2014-11-01 DIAGNOSIS — Z79899 Other long term (current) drug therapy: Secondary | ICD-10-CM | POA: Diagnosis not present

## 2014-11-01 DIAGNOSIS — F332 Major depressive disorder, recurrent severe without psychotic features: Principal | ICD-10-CM | POA: Diagnosis present

## 2014-11-01 DIAGNOSIS — F419 Anxiety disorder, unspecified: Secondary | ICD-10-CM | POA: Diagnosis not present

## 2014-11-01 DIAGNOSIS — F4321 Adjustment disorder with depressed mood: Secondary | ICD-10-CM | POA: Diagnosis present

## 2014-11-01 DIAGNOSIS — F329 Major depressive disorder, single episode, unspecified: Secondary | ICD-10-CM | POA: Diagnosis not present

## 2014-11-01 DIAGNOSIS — R45851 Suicidal ideations: Secondary | ICD-10-CM | POA: Diagnosis not present

## 2014-11-01 MED ORDER — CHLORDIAZEPOXIDE HCL 25 MG PO CAPS
25.0000 mg | ORAL_CAPSULE | Freq: Four times a day (QID) | ORAL | Status: AC
  Administered 2014-11-01 (×3): 25 mg via ORAL
  Filled 2014-11-01 (×3): qty 1

## 2014-11-01 MED ORDER — FLUOXETINE HCL 20 MG PO CAPS
20.0000 mg | ORAL_CAPSULE | Freq: Every day | ORAL | Status: DC
  Administered 2014-11-01 – 2014-11-06 (×5): 20 mg via ORAL
  Filled 2014-11-01 (×3): qty 1
  Filled 2014-11-01: qty 3
  Filled 2014-11-01 (×5): qty 1

## 2014-11-01 MED ORDER — CHLORDIAZEPOXIDE HCL 25 MG PO CAPS
25.0000 mg | ORAL_CAPSULE | Freq: Every day | ORAL | Status: AC
  Administered 2014-11-04: 25 mg via ORAL
  Filled 2014-11-01: qty 1

## 2014-11-01 MED ORDER — OLANZAPINE 2.5 MG PO TABS
2.5000 mg | ORAL_TABLET | Freq: Every day | ORAL | Status: DC
  Administered 2014-11-01: 2.5 mg via ORAL
  Filled 2014-11-01 (×3): qty 1

## 2014-11-01 MED ORDER — ACETAMINOPHEN 325 MG PO TABS
650.0000 mg | ORAL_TABLET | Freq: Four times a day (QID) | ORAL | Status: DC | PRN
  Administered 2014-11-01 – 2014-11-06 (×5): 650 mg via ORAL
  Filled 2014-11-01 (×5): qty 2

## 2014-11-01 MED ORDER — ONDANSETRON 4 MG PO TBDP: 4.0000 mg | ORAL_TABLET | Freq: Four times a day (QID) | ORAL | Status: AC | PRN

## 2014-11-01 MED ORDER — CHLORDIAZEPOXIDE HCL 25 MG PO CAPS
25.0000 mg | ORAL_CAPSULE | Freq: Three times a day (TID) | ORAL | Status: AC
  Administered 2014-11-02 (×2): 25 mg via ORAL
  Filled 2014-11-01 (×2): qty 1

## 2014-11-01 MED ORDER — CHLORDIAZEPOXIDE HCL 25 MG PO CAPS
25.0000 mg | ORAL_CAPSULE | Freq: Four times a day (QID) | ORAL | Status: AC | PRN
  Administered 2014-11-02 – 2014-11-03 (×2): 25 mg via ORAL
  Filled 2014-11-01: qty 1

## 2014-11-01 MED ORDER — NICOTINE POLACRILEX 2 MG MT GUM
2.0000 mg | CHEWING_GUM | OROMUCOSAL | Status: DC | PRN
  Administered 2014-11-01 – 2014-11-04 (×5): 2 mg via ORAL
  Filled 2014-11-01 (×4): qty 1

## 2014-11-01 MED ORDER — CHLORDIAZEPOXIDE HCL 25 MG PO CAPS
25.0000 mg | ORAL_CAPSULE | ORAL | Status: AC
  Administered 2014-11-03: 25 mg via ORAL
  Filled 2014-11-01 (×2): qty 1

## 2014-11-01 MED ORDER — HYDROXYZINE HCL 25 MG PO TABS
25.0000 mg | ORAL_TABLET | Freq: Four times a day (QID) | ORAL | Status: DC | PRN
  Administered 2014-11-03 – 2014-11-06 (×5): 25 mg via ORAL
  Filled 2014-11-01 (×6): qty 1

## 2014-11-01 MED ORDER — MAGNESIUM HYDROXIDE 400 MG/5ML PO SUSP: 30.0000 mL | Freq: Every day | ORAL | Status: DC | PRN

## 2014-11-01 MED ORDER — ALUM & MAG HYDROXIDE-SIMETH 200-200-20 MG/5ML PO SUSP: 30.0000 mL | ORAL | Status: DC | PRN

## 2014-11-01 MED ORDER — LOPERAMIDE HCL 2 MG PO CAPS
2.0000 mg | ORAL_CAPSULE | ORAL | Status: AC | PRN
  Administered 2014-11-01 – 2014-11-02 (×2): 4 mg via ORAL
  Administered 2014-11-03: 2 mg via ORAL
  Filled 2014-11-01: qty 2
  Filled 2014-11-01: qty 1
  Filled 2014-11-01: qty 2

## 2014-11-01 MED ORDER — ADULT MULTIVITAMIN W/MINERALS CH
1.0000 | ORAL_TABLET | Freq: Every day | ORAL | Status: DC
  Administered 2014-11-01 – 2014-11-06 (×5): 1 via ORAL
  Filled 2014-11-01 (×9): qty 1

## 2014-11-01 NOTE — Tx Team (Signed)
Initial Interdisciplinary Treatment Plan   PATIENT STRESSORS: Financial difficulties   PATIENT STRENGTHS: Ability for insight Average or above average intelligence Capable of independent living Communication skills Physical Health   PROBLEM LIST: Problem List/Patient Goals Date to be addressed Date deferred Reason deferred Estimated date of resolution  "feeling depressed" 11/01/14     " Thoughts to hurt self" 11/01/14                                                DISCHARGE CRITERIA:  Ability to meet basic life and health needs Adequate post-discharge living arrangements Improved stabilization in mood, thinking, and/or behavior Reduction of life-threatening or endangering symptoms to within safe limits  PRELIMINARY DISCHARGE PLAN: Outpatient therapy Return to previous living arrangement  PATIENT/FAMIILY INVOLVEMENT: This treatment plan has been presented to and reviewed with the patient, Tyler Lambert, and/or family member, .  The patient and family have been given the opportunity to ask questions and make suggestions.  Manuela Schwartzritchett, Isbella [facility] 11/01/2014, 2:09 AM

## 2014-11-01 NOTE — ED Notes (Signed)
Report called to RN Victorino DikeJennifer, [facility].  Pending [facility] transport at 1:00am.

## 2014-11-01 NOTE — Progress Notes (Signed)
D:Pt is anxious with mild tremors and reports that he did not sleep well. Pt irritable after talking with NP due to not getting the medications that he was taking before coming to the hospital. Pt reports passive si thoughts. A:Discussed side effect to the medications that he was taking and explained the medications that the NP ordered. R:Pt contracts with staff for safety. Safety maintained on the unit.
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
## 2014-11-01 NOTE — Progress Notes (Signed)
Patient ID: Tyler Lambert, male   DOB: 08/17/1975, 39 y.o.   MRN: [id number]  Adult Psychoeducational Group Note  Date:  11/01/2014 Time:  09:30  Group Topic/Focus:   Orientation:   The focus of this group is to educate the patient on the purpose and policies of crisis stabilization and provide a format to answer questions about their admission.  The group details unit policies and expectations of patients while admitted. Wellness Toolbox:   The focus of this group is to discuss various aspects of wellness, balancing those aspects and exploring ways to increase the ability to experience wellness.  Patients will create a wellness toolbox for use upon discharge.  Participation Level:  Active  Participation Quality:  Appropriate  Affect:  Flat  Cognitive:  Appropriate  Insight: Appropriate   Engagement in Group:  Engaged  Modes of Intervention:  Discussion, Education, Orientation and Support  Additional Comments:  Pt a positive part of group today. Pt able to identify one goal to achieve today.   Aurora Maskwyman, Arjen Deringer E 11/01/2014, 9:57 AM

## 2014-11-02 DIAGNOSIS — R45851 Suicidal ideations: Secondary | ICD-10-CM

## 2014-11-02 DIAGNOSIS — F4321 Adjustment disorder with depressed mood: Secondary | ICD-10-CM | POA: Insufficient documentation

## 2014-11-02 LAB — LIPID PANEL
Cholesterol: 182 mg/dL (ref 0–200)
HDL: 35 mg/dL — ABNORMAL LOW (ref >39)
LDL CALC: 76 mg/dL (ref 0–99)
Total CHOL/HDL Ratio: 5.2 RATIO
Triglycerides: 353 mg/dL — ABNORMAL HIGH (ref <150)
VLDL: 71 mg/dL — AB (ref 0–40)

## 2014-11-02 MED ORDER — BENZTROPINE MESYLATE 0.5 MG PO TABS
0.5000 mg | ORAL_TABLET | Freq: Two times a day (BID) | ORAL | Status: DC
  Administered 2014-11-02 – 2014-11-03 (×3): 0.5 mg via ORAL
  Filled 2014-11-02 (×8): qty 1

## 2014-11-02 MED ORDER — OLANZAPINE 5 MG PO TABS
5.0000 mg | ORAL_TABLET | Freq: Every day | ORAL | Status: DC
  Administered 2014-11-02 – 2014-11-04 (×3): 5 mg via ORAL
  Filled 2014-11-02 (×6): qty 1

## 2014-11-02 NOTE — Progress Notes (Signed)
[name] Progress Note  11/02/2014 2:18 PM Tyler Lambert  MRN:  [id number] Subjective:  Patient states that he feels like he is really "anxious, antsy, jittery".  "I did cocaine 2 days prior to come in here and when I had done it before I use to drink; but now I don't drink no more and a big upper like that don't mix well with my social anxiety."  Patient denies suicidal/homicidal ideation and paranoia.  Patient continues to endorse anxiousness.  Patient appears depressed with flat affect, withdrawn.  Will Increased Zyprexa to 5 mg qhs and added Cogentin 0.5 mg BID    Principal Problem: Major depressive disorder, recurrent episode, severe Diagnosis:   Patient Active Problem List   Diagnosis Date Noted  . Major depressive disorder, recurrent episode, severe [F33.2] 11/01/2014  . MDD (major depressive disorder) [F32.2] 03/26/2013  . ADHD (attention deficit hyperactivity disorder) [F90.9] 03/26/2013  . Anxiety [F41.9]   . Depression [F32.9]   . Acid reflux [K21.9]   . Irritable bowel syndrome [K58.9]    Total Time spent with patient: 30 minutes   Past Medical History:  Past Medical History  Diagnosis Date  . Anxiety   . Depression   . Acid reflux   . Irritable bowel syndrome   . PTSD (post-traumatic stress disorder)   . Bipolar 1 disorder   . ADHD (attention deficit hyperactivity disorder)   . Insomnia     Past Surgical History  Procedure Laterality Date  . Upper gastrointestinal endoscopy     Family History:  Family History  Problem Relation Age of Onset  . Heart failure Father    Social History:  History  Alcohol Use No    Comment: Reports had an issue in the past     History  Drug Use No    Comment: Denies drug use but positive cocaine; denied after confronted with result    History   Social History  . Marital Status: Legally Separated    Spouse Name: N/A  . Number of Children: N/A  . Years of Education: N/A   Social History Main Topics  . Smoking status:  Never Smoker   . Smokeless tobacco: Current User    Types: Snuff  . Alcohol Use: No     Comment: Reports had an issue in the past  . Drug Use: No     Comment: Denies drug use but positive cocaine; denied after confronted with result  . Sexual Activity: Not on file   Other Topics Concern  . None   Social History Narrative   Additional History:    Sleep: Poor  Appetite:  Fair   Assessment:   Musculoskeletal: Strength & Muscle Tone: within normal limits Gait & Station: normal Patient leans: N/A   Psychiatric Specialty Exam: Physical Exam  Constitutional: He is oriented to person, place, and time.  Neck: Normal range of motion.  Respiratory: Effort normal.  Musculoskeletal: Normal range of motion.  Neurological: He is alert and oriented to person, place, and time.  Psychiatric: His mood appears anxious. He is withdrawn. He exhibits a depressed mood.    Review of Systems  Psychiatric/Behavioral: Positive for depression. Negative for hallucinations and memory loss. Suicidal ideas: Denies at this time. The patient is nervous/anxious and has insomnia.     Blood pressure 109/69, pulse 75, temperature 97.9 F (36.6 C), temperature source Oral, resp. rate 16, height  (1.727 m), weight 80.287 kg (177 lb).Body mass index is 26.92 kg/(m^2).  General Appearance:  Casual  Eye Contact::  Fair  Speech:  Clear and Coherent and Normal Rate  Volume:  Decreased  Mood:  Anxious, Depressed and Hopeless  Affect:  Depressed and Flat  Thought Process:  Circumstantial and Goal Directed  Orientation:  Full (Time, Place, and Person)  Thought Content:  "I'm just really anxious right now"  Suicidal Thoughts:  Yes.  without intent/planDenies at this time  Homicidal Thoughts:  No  Memory:  Immediate;   Good Recent;   Good Remote;   Good  Judgement:  Fair  Insight:  Lacking  Psychomotor Activity:  Normal  Concentration:  Fair  Recall:  Good  Fund of Knowledge:Fair  Language: Good   Akathisia:  No  Handed:  Right  AIMS (if indicated):     Assets:  Communication Skills Desire for Improvement Housing Social Support  ADL's:  Intact  Cognition: WNL  Sleep:  Number of Hours: 5.75     Current Medications: Current Facility-Administered Medications  Medication Dose Route Frequency Provider Last Rate Last Dose  . acetaminophen (TYLENOL) tablet 650 mg  650 mg Oral Q6H PRN Worthy Flank, NP   650 mg at 11/01/14 1313  . alum & mag hydroxide-simeth (MAALOX/MYLANTA) 200-200-20 MG/5ML suspension 30 mL  30 mL Oral Q4H PRN Worthy Flank, NP      . chlordiazePOXIDE (LIBRIUM) capsule 25 mg  25 mg Oral Q6H PRN Donney Caraveo B Ein Rijo, NP      . chlordiazePOXIDE (LIBRIUM) capsule 25 mg  25 mg Oral TID Jessi Pitstick B Macall Mccroskey, NP   25 mg at 11/02/14 1143   Followed by  . [START ON 11/03/2014] chlordiazePOXIDE (LIBRIUM) capsule 25 mg  25 mg Oral BH-qamhs Zyrion Coey B Shawnn Bouillon, NP       Followed by  . [START ON 11/04/2014] chlordiazePOXIDE (LIBRIUM) capsule 25 mg  25 mg Oral Daily Yadir Zentner B Serrina Minogue, NP      . FLUoxetine (PROZAC) capsule 20 mg  20 mg Oral Daily Cordney Barstow B Junie Engram, NP   20 mg at 11/01/14 1142  . hydrOXYzine (ATARAX/VISTARIL) tablet 25 mg  25 mg Oral Q6H PRN Ziara Thelander B Layla Gramm, NP      . loperamide (IMODIUM) capsule 2-4 mg  2-4 mg Oral PRN Jenner Rosier B Saige Busby, NP   4 mg at 11/01/14 2155  . magnesium hydroxide (MILK OF MAGNESIA) suspension 30 mL  30 mL Oral Daily PRN Worthy Flank, NP      . multivitamin with minerals tablet 1 tablet  1 tablet Oral Daily Davanna He B Krisha Beegle, NP   1 tablet at 11/01/14 1142  . nicotine polacrilex (NICORETTE) gum 2 mg  2 mg Oral PRN Craige Cotta, MD   2 mg at 11/01/14 1643  . OLANZapine (ZYPREXA) tablet 2.5 mg  2.5 mg Oral QHS Oletta Darter, MD   2.5 mg at 11/01/14 2155  . ondansetron (ZOFRAN-ODT) disintegrating tablet 4 mg  4 mg Oral Q6H PRN Dezaray Shibuya B Jaydi Bray, NP        Lab Results:  Results for orders placed or performed during the hospital encounter of 11/01/14  (from the past 48 hour(s))  Lipid panel     Status: Abnormal   Collection Time: 11/02/14  6:55 AM  Result Value Ref Range   Cholesterol 182 0 - 200 mg/dL   Triglycerides 161 (H) <150 mg/dL   HDL 35 (L) >09 mg/dL   Total CHOL/HDL Ratio 5.2 RATIO   VLDL 71 (H) 0 - 40 mg/dL   LDL Cholesterol 76 0 - 99  mg/dL    Comment:        Total Cholesterol/HDL:CHD Risk Coronary Heart Disease Risk Table                     Men   Women  1/2 Average Risk   3.4   3.3  Average Risk       5.0   4.4  2 X Average Risk   9.6   7.1  3 X Average Risk  23.4   11.0        Use the calculated Patient Ratio above and the CHD Risk Table to determine the patient's CHD Risk.        ATP III CLASSIFICATION (LDL):  <100     mg/dL   Optimal  [id number]  mg/dL   Near or Above                    Optimal  130-159  mg/dL   Borderline  [id number]  mg/dL   High  >401>190     mg/dL   Very High [facility]     Physical Findings: AIMS: Facial and Oral Movements Muscles of Facial Expression: None, normal Lips and Perioral Area: None, normal Jaw: None, normal Tongue: None, normal,Extremity Movements Upper (arms, wrists, hands, fingers): None, normal Lower (legs, knees, ankles, toes): None, normal, Trunk Movements Neck, shoulders, hips: None, normal, Overall Severity Severity of abnormal movements (highest score from questions above): None, normal Incapacitation due to abnormal movements: None, normal Patient's awareness of abnormal movements (rate only patient's report): No Awareness, Dental Status Current problems with teeth and/or dentures?: No Does patient usually wear dentures?: No  CIWA:  CIWA-Ar Total: 5 COWS:     Treatment Plan Summary: Daily contact with patient to assess and evaluate symptoms and progress in treatment and Medication management   1. Admit for crisis management and stabilization.  2. Medication management to reduce current symptoms to base line and improve the patient's  overall level of functioning:  Increased Zyprexa to 5 mg qhs and added Cogentin 0.5 mg BID 3. Treat health problems as indicated.  4. Develop treatment plan to decrease risk of relapse upon discharge and the need for    readmission.  5. Psycho-social education regarding relapse prevention and self- care.  6. Health care follow up as needed for medical problems.  7. Restart home medications where appropriate.  Will continue with current treatment plan   Medical Decision Making:  Established Problem, Stable/Improving (1), Review of Psycho-Social Stressors (1), Review of Last Therapy Session (1), Review of Medication Regimen & Side Effects (2) and Review of New Medication or Change in Dosage (2)   Brian Zeitlin, FNP-BC 11/02/2014, 2:18 PM

## 2014-11-02 NOTE — [facility] Group Notes (Signed)
[facility] Group Notes:  (Nursing/MHT/Case Management/Adjunct)  Date:  11/02/2014  Time:  0900 am  Type of Therapy:  Psychoeducational Skills  Participation Level:  Did Not Attend   Dason Mosley Evans 11/02/2014, 2:35 PM 
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
## 2014-11-02 NOTE — Progress Notes (Signed)
Patient ID: Tyler Lambert, male   DOB: [birth year], 39 y.o.   MRN: [id number] D-Has been isolative most of shift. Slept until noon, and states he is trying to catch up on sleep. Flat affect. Offers minimal conversation. A-Support offered. Monitored for safety. Medications as ordered. He did not get am Librium since he did not get up until noon. R-No complaints voiced. No behavior problems.

## 2014-11-02 NOTE — Progress Notes (Signed)
[facility] Group Notes:  (Nursing/MHT/Case Management/Adjunct)  Date:  11/02/2014  Time:  12:13 AM  Type of Therapy:  Group Therapy  Participation Level:  Minimal  Participation Quality:  Appropriate  Affect:  Appropriate  Cognitive:  Appropriate  Insight:  Appropriate  Engagement in Group:  Developing/Improving  Modes of Intervention:  Socialization and Support  Summary of Progress/Problems: Pt. Stated his energy level a 5.  Pt. Stated his family makes up his support system.  Sondra ComeWilson, Asta Corbridge J 11/02/2014, 12:13 AM

## 2014-11-02 NOTE — [facility] Suicide Risk Assessment (Signed)
[facility] INPATIENT:  Family/Significant Other Suicide Prevention Education  Suicide Prevention Education:  Patient Refusal for Family/Significant Other Suicide Prevention Education: The patient Tyler Lambert has refused to provide written consent for family/significant other to be provided Family/Significant Other Suicide Prevention Education during admission and/or prior to discharge.  Physician notified.  SPE was done with patient and brochure given.  Sarina SerGrossman-Orr, Lutricia Widjaja Jo 11/02/2014, 4:23 PM

## 2014-11-02 NOTE — Progress Notes (Signed)
Patient ID: Tyler Lambert, male   DOB: [birth year], 39 y.o.   MRN: [id number] D)  Has been rather isolative this evening, went to group, but afterward went to his room, was reading.  Affect is flat, depressed mood, states is having a hard time with detox, c/o diarrhea and was given immoduim, gatorade, fluids encouraged.  Was given scheduled hs librium and zyprexa, also vistaril which he said has been helpful with his anxiety.  Denies thoughts of self harm.  Programmed tonight for depression, but agreed to try going to the AA/NA meeting tomorrow night. A)  Support, encouragement, will continue to monitor for safety, continue POC R)  Safety maintained.

## 2014-11-02 NOTE — [facility] Group Notes (Signed)
[facility] Group Notes:  (Clinical Social Work)  11/02/2014  1:15-2:45pm  Summary of Progress/Problems:   The main focus of today's process group was to   1)  Discuss the importance of adding supports  2)  Experience various pieces of music and discuss feelings evoked  3)  Identify the patient's current healthy supports and plan what to add.  An emphasis was placed on using counselor, doctor, therapy groups, 12-step groups, and problem-specific support groups to expand supports.  Suggestions were generated by group about non-professional supports that could possibly be added, including animals, volunteer work, music, coloring, hobbies, church, etc.  The patient expressed full comprehension of the concepts presented, and agreed that there is a need to add more supports.  The patient was very quiet during group, but when CSW called on him directly, answered thoughtfully and fully.  Type of Therapy:  Processing Group  Participation Level:  Active  Participation Quality:  Attentive and Sharing  Affect:  Anxious, Depressed and Flat  Cognitive:  Oriented  Insight:  Developing/Improving  Engagement in Therapy:  Engaged  Modes of Intervention:    Activity and Processing  Ambrose MantleMareida Grossman-Orr, LCSW 11/02/2014, 3:40pm

## 2014-11-03 MED ORDER — ENSURE ENLIVE PO LIQD
237.0000 mL | Freq: Two times a day (BID) | ORAL | Status: DC
  Administered 2014-11-03 – 2014-11-05 (×4): 237 mL via ORAL
  Filled 2014-11-03 (×10): qty 237

## 2014-11-03 MED ORDER — GABAPENTIN 100 MG PO CAPS
100.0000 mg | ORAL_CAPSULE | Freq: Two times a day (BID) | ORAL | Status: DC
  Administered 2014-11-03 – 2014-11-05 (×4): 100 mg via ORAL
  Filled 2014-11-03 (×8): qty 1

## 2014-11-03 NOTE — Progress Notes (Signed)
Recreation Therapy Notes  Date: 03.21.2016 Time: 9:30am Location: [address] Group Room   Group Topic: Stress Management  Goal Area(s) Addresses:  Patient will actively participate in stress management techniques presented during session.   Behavioral Response: Did not attend.   Margarett Viti L Aniel Hubble, LRT/CTRS  Jaeliana Lococo L 11/03/2014 10:15 AM 

## 2014-11-03 NOTE — Progress Notes (Addendum)
Patient ID: Tyler Lambert, male   DOB: 06/17/1976, 39 y.o.   MRN: [id number] [facility] MD Progress Note  11/03/2014 5:27 PM DAELYN MOZER  MRN:  [id number] Subjective:  Patient reports feeling uncomfortable with anxiety, and symptoms of withdrawal. He denies medication side effects.  Objective : I have discussed case with treatment team and have met with patient. Patient presents with depression, sadness, and anxiety, but denies plan or intention of hurting self and contracts for safety on the unit. States he is tolerating medications well. On unit has tended to isolate, keep to self, with little interactions with others, which he attributes at least partially to significant anxiety . No disruptive behaviors on unit.  At present has mild distal tremors, does not appear to be in any acute distress. No diaphoresis. Does have significant subjective anxiety and subjective  sense of agitation.  Labs reviewed, HgbA1C 5.0, normal serum glucose, lipid panel remarkable for hypertriglyceridemia      Principal Problem: Major depressive disorder, recurrent episode, severe Diagnosis:   Patient Active Problem List   Diagnosis Date Noted  . Adjustment disorder with depressed mood [F43.21]   . Major depressive disorder, recurrent episode, severe [F33.2] 11/01/2014  . MDD (major depressive disorder) [F32.2] 03/26/2013  . ADHD (attention deficit hyperactivity disorder) [F90.9] 03/26/2013  . Anxiety [F41.9]   . Depression [F32.9]   . Acid reflux [K21.9]   . Irritable bowel syndrome [K58.9]    Total Time spent with patient: 20 minutes   Past Medical History:  Past Medical History  Diagnosis Date  . Anxiety   . Depression   . Acid reflux   . Irritable bowel syndrome   . PTSD (post-traumatic stress disorder)   . Bipolar 1 disorder   . ADHD (attention deficit hyperactivity disorder)   . Insomnia     Past Surgical History  Procedure Laterality Date  . Upper gastrointestinal endoscopy     Family  History:  Family History  Problem Relation Age of Onset  . Heart failure Father    Social History:  History  Alcohol Use No    Comment: Reports had an issue in the past     History  Drug Use No    Comment: Denies drug use but positive cocaine; denied after confronted with result    History   Social History  . Marital Status: Legally Separated    Spouse Name: N/A  . Number of Children: N/A  . Years of Education: N/A   Social History Main Topics  . Smoking status: Never Smoker   . Smokeless tobacco: Current User    Types: Snuff  . Alcohol Use: No     Comment: Reports had an issue in the past  . Drug Use: No     Comment: Denies drug use but positive cocaine; denied after confronted with result  . Sexual Activity: Not on file   Other Topics Concern  . None   Social History Narrative   Additional History:    Sleep:  Fair   Appetite:  Fair   Assessment:   Musculoskeletal: Strength & Muscle Tone: within normal limits Gait & Station: normal Patient leans: N/A   Psychiatric Specialty Exam: Physical Exam  Constitutional: He is oriented to person, place, and time.  Neck: Normal range of motion.  Respiratory: Effort normal.  Musculoskeletal: Normal range of motion.  Neurological: He is alert and oriented to person, place, and time.  Psychiatric: His mood appears anxious. He is withdrawn. He exhibits a depressed  mood.    Review of Systems  Constitutional: Negative for fever and chills.  Respiratory: Negative for cough and shortness of breath.   Cardiovascular: Negative for chest pain.  Gastrointestinal: Negative for vomiting.  Skin: Negative for rash.  Psychiatric/Behavioral: Positive for depression and substance abuse.    Blood pressure 131/78, pulse 102, temperature 98.8 F (37.1 C), temperature source Oral, resp. rate 16, height 5' 8" (1.727 m), weight 177 lb (80.287 kg).Body mass index is 26.92 kg/(m^2).  General Appearance: Fairly Groomed  Eye Contact::   Fair  Speech:  Clear and Coherent and Normal Rate  Volume:  Decreased  Mood:  Depressed  Affect:  Depressed and Flat  Thought Process:  Goal Directed and Linear  Orientation:  Full (Time, Place, and Person)  Thought Content:  No hallucinations, no delusions,ruminative, anxious   Suicidal Thoughts:  NoDenies  Any thoughts of hurting self at this time  Homicidal Thoughts:  No  Memory:  Immediate;   Good Recent;   Good Remote;   Good  Judgement:  Fair  Insight:  Fair  Psychomotor Activity:  Normal, no psychomotor agitation or restlessness , feels subjectively agitated   Concentration:  Fair  Recall:  Good  Fund of Knowledge:Fair  Language: Good  Akathisia:  No  Handed:  Right  AIMS (if indicated):     Assets:  Communication Skills Desire for Improvement Housing Social Support  ADL's:  Intact  Cognition: WNL  Sleep:  Number of Hours: 5.75     Current Medications: Current Facility-Administered Medications  Medication Dose Route Frequency Provider Last Rate Last Dose  . acetaminophen (TYLENOL) tablet 650 mg  650 mg Oral Q6H PRN Ijeoma E Nwaeze, NP   650 mg at 11/03/14 1215  . alum & mag hydroxide-simeth (MAALOX/MYLANTA) 200-200-20 MG/5ML suspension 30 mL  30 mL Oral Q4H PRN Ijeoma E Nwaeze, NP      . benztropine (COGENTIN) tablet 0.5 mg  0.5 mg Oral BID Shuvon B Rankin, NP   0.5 mg at 11/03/14 1211  . chlordiazePOXIDE (LIBRIUM) capsule 25 mg  25 mg Oral Q6H PRN Shuvon B Rankin, NP   25 mg at 11/02/14 2255  . chlordiazePOXIDE (LIBRIUM) capsule 25 mg  25 mg Oral BH-qamhs Shuvon B Rankin, NP   25 mg at 11/03/14 1212   Followed by  . [START ON 11/04/2014] chlordiazePOXIDE (LIBRIUM) capsule 25 mg  25 mg Oral Daily Shuvon B Rankin, NP      . feeding supplement (ENSURE ENLIVE) (ENSURE ENLIVE) liquid 237 mL  237 mL Oral BID BM Lindsey Baker, RD   237 mL at 11/03/14 1436  . FLUoxetine (PROZAC) capsule 20 mg  20 mg Oral Daily Shuvon B Rankin, NP   20 mg at 11/03/14 1212  . hydrOXYzine  (ATARAX/VISTARIL) tablet 25 mg  25 mg Oral Q6H PRN Shuvon B Rankin, NP      . loperamide (IMODIUM) capsule 2-4 mg  2-4 mg Oral PRN Shuvon B Rankin, NP   2 mg at 11/03/14 1438  . magnesium hydroxide (MILK OF MAGNESIA) suspension 30 mL  30 mL Oral Daily PRN Ijeoma E Nwaeze, NP      . multivitamin with minerals tablet 1 tablet  1 tablet Oral Daily Shuvon B Rankin, NP   1 tablet at 11/03/14 1212  . nicotine polacrilex (NICORETTE) gum 2 mg  2 mg Oral PRN  A , MD   2 mg at 11/03/14 1450  . OLANZapine (ZYPREXA) tablet 5 mg  5 mg Oral QHS Shuvon   B Rankin, NP   5 mg at 11/02/14 2257  . ondansetron (ZOFRAN-ODT) disintegrating tablet 4 mg  4 mg Oral Q6H PRN Shuvon B Rankin, NP        Lab Results:  Results for orders placed or performed during the hospital encounter of 11/01/14 (from the past 48 hour(s))  Lipid panel     Status: Abnormal   Collection Time: 11/02/14  6:55 AM  Result Value Ref Range   Cholesterol 182 0 - 200 mg/dL   Triglycerides 353 (H) <150 mg/dL   HDL 35 (L) >39 mg/dL   Total CHOL/HDL Ratio 5.2 RATIO   VLDL 71 (H) 0 - 40 mg/dL   LDL Cholesterol 76 0 - 99 mg/dL    Comment:        Total Cholesterol/HDL:CHD Risk Coronary Heart Disease Risk Table                     Men   Women  1/2 Average Risk   3.4   3.3  Average Risk       5.0   4.4  2 X Average Risk   9.6   7.1  3 X Average Risk  23.4   11.0        Use the calculated Patient Ratio above and the CHD Risk Table to determine the patient's CHD Risk.        ATP III CLASSIFICATION (LDL):  <100     mg/dL   Optimal  100-129  mg/dL   Near or Above                    Optimal  130-159  mg/dL   Borderline  160-189  mg/dL   High  >190     mg/dL   Very High Performed at [facility]   Hemoglobin A1c     Status: None   Collection Time: 11/02/14  6:55 AM  Result Value Ref Range   Hgb A1c MFr Bld 5.1 4.8 - 5.6 %    Comment: (NOTE)         Pre-diabetes: 5.7 - 6.4         Diabetes: >6.4         Glycemic  control for adults with diabetes: <7.0    Mean Plasma Glucose 100 mg/dL    Comment: (NOTE) Performed At: [facility] [place], [place] [id number] Lindon Romp MD HR:[id number] Performed at [facility]     Physical Findings: AIMS: Facial and Oral Movements Muscles of Facial Expression: None, normal Lips and Perioral Area: None, normal Jaw: None, normal Tongue: None, normal,Extremity Movements Upper (arms, wrists, hands, fingers): None, normal Lower (legs, knees, ankles, toes): None, normal, Trunk Movements Neck, shoulders, hips: None, normal, Overall Severity Severity of abnormal movements (highest score from questions above): None, normal Incapacitation due to abnormal movements: None, normal Patient's awareness of abnormal movements (rate only patient's report): No Awareness, Dental Status Current problems with teeth and/or dentures?: No Does patient usually wear dentures?: No  CIWA:  CIWA-Ar Total: 4 COWS:      Assessment - patient is depressed, subjectively anxious, and describes subjective sense of feeling jittery and agitated.  Has tended to be isolative . No gross tremors or diaphoresis noted. At this time not suicidal . Tolerating medications well.   Treatment Plan Summary: Daily contact with patient to assess and evaluate symptoms and progress in treatment and Medication management Continue Prozac 20 mgrs QDAY  Continue Librium detox protocol Continue Zyprexa 5 mgrs QHS D/C Cogentin as no  Current EPS or antipsychotic side effects and may be contributing to side effects  Neurontin may help address anxiety , which is chronic and significant- start 100 mgrs BID.    Medical Decision Making:  Established Problem, Stable/Improving (1), Review of Psycho-Social Stressors (1), Review of Last Therapy Session (1), Review of Medication Regimen & Side Effects (2) and Review of New Medication or Change in Dosage (2)   ,  ,  11/03/2014, 5:27 PM  

## 2014-11-03 NOTE — Plan of Care (Signed)
Problem: Diagnosis: Increased Risk For Suicide Attempt Goal: STG-Patient Will Attend All Groups On The Unit Outcome: Not Progressing Patient is not attending groups at this time. Patient continues to report withdrawal symptoms.

## 2014-11-03 NOTE — Progress Notes (Signed)
Patient ID: Tyler Lambert, male   DOB: 07/04/1976, 39 y.o.   MRN: [id number] D)  Has bween rather quiet this evening, but did go to the cafeteria to have a visit with family.  Stated the visit went well, just not feeling well d/t detox, pale.  Did attend the AA meeting tonight, has been interacting appropriately with select peers and staff.  Stated has been having diarrhea, c/o headache, feeling shakey, was given gatorade, fluids encouraged, medicated per orders. Denies thoughts of self harm, contracts for safety. A)  Will continue to monitor for safety, continue POC R)  Safety maintained

## 2014-11-03 NOTE — [facility] Group Notes (Signed)
[facility] LCSW Group Therapy          Overcoming Obstacles       1:15 -2:30        11/03/2014   4:17 PM     Type of Therapy:  Group Therapy  Participation Level:  Appropriate  Participation Quality:  Appropriate  Affect:  Depressed  Cognitive:  Attentive Appropriate  Insight: Developing/Improving   Engagement in Therapy: Developing/Imprvoing   Modes of Intervention:  Discussion Exploration  Education Rapport BuildingProblem-Solving Support  Summary of Progress/Problems:  The main focus of today's group was overcoming obstacles.  He shared anxiety is the obstacle he has to overcome.  He shared he was doing well on medications. He advised of spending time with an old friend that led to negative behaviors.  Patient able to identify appropriate coping skills make sure he stays on the right path.   Wynn BankerHodnett, Tyler Lambert 11/03/2014   4:17 PM

## 2014-11-03 NOTE — Progress Notes (Signed)
Patient ID: Tyler Lambert, male   DOB: [birth year], 39 y.o.   MRN: [id number]  DAR: Pt. Denies SI/HI and A/V Hallucinations. Patient reports generalized pain and received Tylenol for this. Patient reports withdrawal symptoms including, pain, anxiety, and tremors. CIWA was a 4 at 1200. Support and encouragement provided to the patient. Scheduled medications administered to patient however, late due to patient not feeling well enough to not get medications at scheduled time. Treatment notified of patient being in bed. Patient is minimal and stays mainly in his room due to not feeling well. Q15 minute checks are maintained for safety.

## 2014-11-03 NOTE — Progress Notes (Signed)
Patient endorsed feeling anxious and edgy all day. He denied SI/HI and denied Hallucinations. He received his HS medications Librium and Zyprexa and requested for Vistaril. Writer told patient that both Vistaril and Zyprexa would help him with anxiety. He appeared irritable and not happy about not getting Vistaril with other medications. Writer encouraged patient to give his HS medications one hour before getting extra medication. Q 15 minute check continues as ordered to maintain safety

## 2014-11-03 NOTE — [facility] Suicide Risk Assessment (Signed)
[facility] INPATIENT:  Family/Significant Other Suicide Prevention Education  Suicide Prevention Education:  Patient Refusal for Family/Significant Other Suicide Prevention Education: The patient Tyler Lambert has refused to provide written consent for family/significant other to be provided Family/Significant Other Suicide Prevention Education during admission and/or prior to discharge.  Physician notified.  Wynn BankerHodnett, Bogdan Vivona Hairston 11/03/2014, 3:56 PM

## 2014-11-03 NOTE — [facility] Group Notes (Signed)
[facility]   11/03/2014 9:58 AM    Participation Quality:  Patient did not attend group.   Tyler Lambert, Tyler Lambert 11/03/2014   9:58 AM

## 2014-11-03 NOTE — Progress Notes (Signed)
NUTRITION ASSESSMENT  Pt identified as at risk on the Malnutrition Screen Tool  INTERVENTION: 1. RD to follow-up with pt at a later time. 2. Supplements: Ensure Enlive po BID, each supplement provides 350 kcal and 20 grams of protein  NUTRITION DIAGNOSIS: Unintentional weight loss related to sub-optimal intake as evidenced by pt report.   Goal: Pt to meet >/= 90% of their estimated nutrition needs.  Monitor:  PO intake  Assessment:  Pt admitted with depression, anxiety and polysubstance abuse.   RD unable to wake pt for assessment. Pt experiencing withdrawal. Per weight history documentation, pt with 23 lb weight loss since June 2015 (23% weight loss x 9 months, significant for time frame).  Pt would benefit from nutritional supplementation. RD will follow-up with pt during next visit.  Height: Ht Readings from Last 1 Encounters:  11/01/14 5\' 8"  (1.727 m)    Weight: Wt Readings from Last 1 Encounters:  11/01/14 177 lb (80.287 kg)    Weight Hx: Wt Readings from Last 10 Encounters:  11/01/14 177 lb (80.287 kg)  01/30/14 200 lb (90.719 kg)  11/16/13 195 lb (88.451 kg)  07/06/13 184 lb (83.462 kg)  05/31/13 178 lb (80.74 kg)  03/26/13 176 lb (79.833 kg)  03/30/12 180 lb (81.647 kg)  02/24/11 175 lb (79.379 kg)    BMI:  Body mass index is 26.92 kg/(m^2). Pt meets criteria for overweight based on current BMI.  Estimated Nutritional Needs: Kcal: 25-30 kcal/kg Protein: > 1 gram protein/kg Fluid: 1 ml/kcal  Diet Order: Diet regular Pt is also offered choice of unit snacks mid-morning and mid-afternoon.  Pt is eating as desired.   Lab results and medications reviewed.   Tilda FrancoLindsey Phineas Mcenroe, MS, RD, LDN Pager: [phone] After Hours Pager: [phone]

## 2014-11-04 DIAGNOSIS — F332 Major depressive disorder, recurrent severe without psychotic features: Secondary | ICD-10-CM | POA: Insufficient documentation

## 2014-11-04 MED ORDER — CLONAZEPAM 0.5 MG PO TABS
0.5000 mg | ORAL_TABLET | Freq: Three times a day (TID) | ORAL | Status: DC
  Administered 2014-11-04 – 2014-11-05 (×4): 0.5 mg via ORAL
  Filled 2014-11-04 (×4): qty 1

## 2014-11-04 MED ORDER — CLONAZEPAM 0.5 MG PO TABS: 0.5000 mg | ORAL_TABLET | Freq: Three times a day (TID) | ORAL | Status: DC | PRN

## 2014-11-04 NOTE — [facility] Group Notes (Signed)
[facility] LCSW Group Therapy      Feelings About Diagnosis 1:15 - 2:30 PM         11/04/2014    Type of Therapy:  Group Therapy  Participation Level:  Active  Participation Quality:  Appropriate  Affect:  Appropriate  Cognitive:  Alert and Appropriate  Insight:  Developing/Improving and Engaged  Engagement in Therapy:  Developing/Improving and Engaged  Modes of Intervention:  Discussion, Education, Exploration, Problem-Solving, Rapport Building, Support  Summary of Progress/Problems:  Patient actively participated in group. Patient discussed past and present diagnosis and the effects it has had on  life.  Patient talked about family and society being judgmental and the stigma associated with having a mental health diagnosis.  He shared he feels as though people are looking at him and talking about him.  He stated he knows this kind is thinking is paranoid but he feels everyone knows there is something wrong with him.    Wynn BankerHodnett, Trinetta Alemu Hairston 11/04/2014

## 2014-11-04 NOTE — Progress Notes (Signed)
Pt attended spiritual care group on grief and loss facilitated by chaplain Burnis KingfisherMatthew Stalnaker and counseling intern Tyler Lambert. Group opened with brief discussion and psycho-social ed around grief and loss in relationships and in relation to self - identifying life patterns, circumstances, changes that cause losses. Established group norm of speaking from own life experience. Group goal of establishing open and affirming space for members to share loss and experience with grief, normalize grief experience and provide psycho social education and grief support.  Group drew on narrative and Alderian therapeutic modalities.   Tyler Lambert was present for the beginning group and psychoeducation but left after group discussion began. Tyler Lambert did not share anything with group before leaving and did not return to group.  Tyler Lambert Counseling Intern

## 2014-11-04 NOTE — Progress Notes (Signed)
Patient ID: Tyler Lambert, male   DOB: [birth year], 39 y.o.   MRN: [id number] [facility] MD Progress Note  11/04/2014 12:22 PM Tyler Lambert  MRN:  [id number] Subjective:  Patient reports he is feeling somewhat better today, but stilll very anxious. Depression and sadness are a little bit better, and he stresses social anxiety as a severe and chronic symptom.  He states he still feels like he is still somewhat shaky.  He denies medication side effects.  Objective : I have discussed case with treatment team and have met with patient.  Patient presents with anxiety, and to a lesser degree depression and sadness, but denies plan or intention of hurting self and contracts for safety on the unit. States he is tolerating medications well. Hecontinues to tend to isolate himself on the unit, he group participation is limited which he attributes to severe anxiety and some vague irritability.  staying on unit during mealtimes.  He is still reporting increased social anxiety and agitation and is requesting to be put back on Xanax or Klonopin. He states he has been on one type of benzo or another for many years. He had been on Klonopin for years and more recently on Xanax.  He states he does not think it is realistic to taper off the benzos at this time as they are "the only thing that really works", he is aware that they are addictive but states that he has been taking them as prescribed.    Principal Problem: Major depressive disorder, recurrent episode, severe Diagnosis:   Patient Active Problem List   Diagnosis Date Noted  . Adjustment disorder with depressed mood [F43.21]   . Major depressive disorder, recurrent episode, severe [F33.2] 11/01/2014  . MDD (major depressive disorder) [F32.2] 03/26/2013  . ADHD (attention deficit hyperactivity disorder) [F90.9] 03/26/2013  . Anxiety [F41.9]   . Depression [F32.9]   . Acid reflux [K21.9]   . Irritable bowel syndrome [K58.9]    Total Time spent with patient:  20 minutes   Past Medical History:  Past Medical History  Diagnosis Date  . Anxiety   . Depression   . Acid reflux   . Irritable bowel syndrome   . PTSD (post-traumatic stress disorder)   . Bipolar 1 disorder   . ADHD (attention deficit hyperactivity disorder)   . Insomnia     Past Surgical History  Procedure Laterality Date  . Upper gastrointestinal endoscopy     Family History:  Family History  Problem Relation Age of Onset  . Heart failure Father    Social History:  History  Alcohol Use No    Comment: Reports had an issue in the past     History  Drug Use No    Comment: Denies drug use but positive cocaine; denied after confronted with result    History   Social History  . Marital Status: Legally Separated    Spouse Name: N/A  . Number of Children: N/A  . Years of Education: N/A   Social History Main Topics  . Smoking status: Never Smoker   . Smokeless tobacco: Current User    Types: Snuff  . Alcohol Use: No     Comment: Reports had an issue in the past  . Drug Use: No     Comment: Denies drug use but positive cocaine; denied after confronted with result  . Sexual Activity: Not on file   Other Topics Concern  . None   Social History Narrative   Additional History:  Sleep:  Improved   Appetite: Improved   Assessment:   Musculoskeletal: Strength & Muscle Tone: within normal limits Gait & Station: normal Patient leans: N/A   Psychiatric Specialty Exam: Physical Exam  Constitutional: He is oriented to person, place, and time.  Neck: Normal range of motion.  Respiratory: Effort normal.  Musculoskeletal: Normal range of motion.  Neurological: He is alert and oriented to person, place, and time.  Psychiatric: His mood appears anxious. He is withdrawn. He exhibits a depressed mood.    Review of Systems  Constitutional: Negative for fever and chills.  Respiratory: Negative for cough and shortness of breath.   Cardiovascular: Negative for  chest pain.  Gastrointestinal: Negative for nausea and vomiting.  Skin: Negative for rash.  Neurological: Negative for headaches.  Psychiatric/Behavioral: Positive for depression. Negative for hallucinations. The patient is nervous/anxious.     Blood pressure 118/73, pulse 105, temperature 97.9 F (36.6 C), temperature source Oral, resp. rate 18, height '5\' 8"'  (1.727 m), weight 177 lb (80.287 kg).Body mass index is 26.92 kg/(m^2).  General Appearance: Fairly Groomed  Engineer, water::  Fair  Speech:  Clear and Coherent and Normal Rate  Volume:  Decreased  Mood:  Depressed   Affect:  Depressed and Flat - a little better related today   Thought Process:  Goal Directed and Linear  Orientation:  Full (Time, Place, and Person)  Thought Content:  No hallucinations, no delusions,ruminative, anxious   Suicidal Thoughts:  NoDenies  Any thoughts of hurting self at this time  Homicidal Thoughts:  No  Memory:  Immediate;   Good Recent;   Good Remote;   Good  Judgement:  Fair  Insight:  Fair  Psychomotor Activity:  Normal, no psychomotor agitation or restlessness , feels subjectively agitated   Concentration:  Fair  Recall:  Good  Fund of Knowledge:Fair  Language: Good  Akathisia:  No  Handed:  Right  AIMS (if indicated):     Assets:  Communication Skills Desire for Improvement Housing Social Support  ADL's:  Intact  Cognition: WNL  Sleep:  Number of Hours: 5.5     Current Medications: Current Facility-Administered Medications  Medication Dose Route Frequency Provider Last Rate Last Dose  . acetaminophen (TYLENOL) tablet 650 mg  650 mg Oral Q6H PRN Harriet Butte, NP   650 mg at 11/03/14 1215  . alum & mag hydroxide-simeth (MAALOX/MYLANTA) 200-200-20 MG/5ML suspension 30 mL  30 mL Oral Q4H PRN Harriet Butte, NP      . feeding supplement (ENSURE ENLIVE) (ENSURE ENLIVE) liquid 237 mL  237 mL Oral BID BM Clayton Bibles, RD   237 mL at 11/04/14 1000  . FLUoxetine (PROZAC) capsule 20 mg   20 mg Oral Daily Shuvon B Rankin, NP   20 mg at 11/04/14 0752  . gabapentin (NEURONTIN) capsule 100 mg  100 mg Oral BID Jenne Campus, MD   100 mg at 11/04/14 0754  . hydrOXYzine (ATARAX/VISTARIL) tablet 25 mg  25 mg Oral Q6H PRN Shuvon B Rankin, NP   25 mg at 11/03/14 2309  . magnesium hydroxide (MILK OF MAGNESIA) suspension 30 mL  30 mL Oral Daily PRN Harriet Butte, NP      . multivitamin with minerals tablet 1 tablet  1 tablet Oral Daily Shuvon B Rankin, NP   1 tablet at 11/04/14 0752  . nicotine polacrilex (NICORETTE) gum 2 mg  2 mg Oral PRN Jenne Campus, MD   2 mg at 11/03/14 1450  .  OLANZapine (ZYPREXA) tablet 5 mg  5 mg Oral QHS Shuvon B Rankin, NP   5 mg at 11/03/14 2156    Lab Results:  No results found for this or any previous visit (from the past 48 hour(s)).  Physical Findings: AIMS: Facial and Oral Movements Muscles of Facial Expression: None, normal Lips and Perioral Area: None, normal Jaw: None, normal Tongue: None, normal,Extremity Movements Upper (arms, wrists, hands, fingers): None, normal Lower (legs, knees, ankles, toes): None, normal, Trunk Movements Neck, shoulders, hips: None, normal, Overall Severity Severity of abnormal movements (highest score from questions above): None, normal Incapacitation due to abnormal movements: None, normal Patient's awareness of abnormal movements (rate only patient's report): No Awareness, Dental Status Current problems with teeth and/or dentures?: No Does patient usually wear dentures?: No  CIWA:  CIWA-Ar Total: 7 COWS:      Assessment - Patient remains depressed and anxious. He stresses anxiety as his major symptom and attributes his isolation to social phobia.  He states he has been on benzos for many years and at this time does not seem interested in tapering off benzos, stating that his anxiety would become unbearable otherwise.  He does state that current medications are helping and are well tolerated.  Today he  denies SI.  Treatment Plan Summary: Daily contact with patient to assess and evaluate symptoms and progress in treatment and Medication management Continue Prozac 20 mgrs QDAY  Continue Zyprexa 5 mgrs QHS Start Klonipin 0.5 mg q8 hours for anxiety Continue Neurontin 100 mg BID    Medical Decision Making:  Established Problem, Stable/Improving (1), Review of Psycho-Social Stressors (1), Review of Last Therapy Session (1), Review of Medication Regimen & Side Effects (2) and Review of New Medication or Change in Dosage (2)   COBOS, FERNANDO,  11/04/2014, 12:22 PM

## 2014-11-04 NOTE — Progress Notes (Signed)
D-pt spent all morning in the bed and has high anxiety A-pt did not attend group this am, but pt did take his medications R-cont to monitor for safety

## 2014-11-04 NOTE — Tx Team (Signed)
Interdisciplinary Treatment Plan Update   Date Reviewed:  11/04/2014  Time Reviewed:  8:46 AM  Progress in Treatment:   Attending groups: Yes, patient is attending groups. Participating in groups: Yes, engages in group discussion. Taking medication as prescribed: Yes  Tolerating medication: Yes Family/Significant other contact made:  No, declined collateral contact Patient understands diagnosis: Yes, patient understands diagnosis and need for treatment. Discussing patient identified problems/goals with staff: Yes, patient is able to express goals for treatment and discharge. Medical problems stabilized or resolved: Yes Denies suicidal/homicidal ideation: Yes Patient has not harmed self or others: Yes  For review of initial/current patient goals, please see plan of care.  Estimated Length of Stay:  2-3 days  Reasons for Continued Hospitalization:  Anxiety Depression Medication stabilization   New Problems/Goals identified:    Discharge Plan or Barriers:   Home with outpatient follow up with [facility]  Additional Comments:   Patient states that he has suffered from social anxiety since high school. "I am really tired of being nervous and anxious all the time. I don't know what causes it. I remember in highschool that people didn't have issues with the same things that I did and now it has gotten to the point that I can't do anything. I cant communicate, I can't have a relationship. When I got to the store I have to sit in the car to build up the nerve to go in and I sit and watch other people go in and out with no problem. I sit up at night and thing about the most random things and know I can't control it, worrying about appointments or just things that I have no control over." Patient endorses suicidal thoughts with a plan "I was having thoughts to go into a patch of wood and take a bunch of pills, go to sleep and hope nobody finds me. But I've tried that before. I  cut both of my wrist and I don't want to do that again. I think it was the most stupidest thing. I got a son at home but all I can do is just sit at home. Patient also endorses paranoia "I feel like people are always watching or talking about me. My logical mind tells me that it's not true; but I still feel that way and can't control it." Patient states that he has outpatient services with Dr. Omelia Blackwater at [facility] and is also seeing a therapist. Patient has a history or inpatient treatment 2014. Patient denies homicidal ideation,and psychosis. Patient states that he has been on multiple antidepressants and none of them worked for him. States the only one he hasn't tried is Prozac. Patient is also taking multiple benzodiazapine (Xanax. Klonopin, Valium) States that Xanax is the only medication for anxiety that has worked for him and wanted to know if it could be increased to four times a day.. Discouraged increase and will start on Librium protocol for benzo withdrawal..   Patient and CSW reviewed patient's identified goals and treatment plan.  Patient verbalized understanding and agreed to treatment plan.   Attendees:  Patient:  11/04/2014 8:46 AM   Signature:  Sallyanne Havers, MD 11/04/2014 8:46 AM  Signature:  11/04/2014 8:46 AM  Signature:  Linton Rump, RN 11/04/2014 8:46 AM  Signature: Michaelle Birks, RN 11/04/2014 8:46 AM  Signature:   11/04/2014 8:46 AM  Signature:  Juline Patch, LCSW 11/04/2014 8:46 AM  Signature:  Belenda Cruise Drinkard, LCSW-A 11/04/2014 8:46 AM  Signature:  Leisa Lenz, Care Coordinator [facility]  11/04/2014 8:46 AM  Signature:   11/04/2014 8:46 AM  Signature: 11/04/2014  8:46 AM  Signature:   Onnie BoerJennifer Clark, RN [facility] 11/04/2014  8:46 AM  Signature:   11/04/2014  8:46 AM    Scribe for Treatment Team:   Juline PatchQuylle Judd Mccubbin,  11/04/2014 8:46 AM

## 2014-11-05 LAB — HEMOGLOBIN A1C
HEMOGLOBIN A1C: 5.1 % (ref 4.8–5.6)
MEAN PLASMA GLUCOSE: 100 mg/dL

## 2014-11-05 MED ORDER — GABAPENTIN 100 MG PO CAPS
200.0000 mg | ORAL_CAPSULE | Freq: Two times a day (BID) | ORAL | Status: DC
  Administered 2014-11-05 – 2014-11-06 (×2): 200 mg via ORAL
  Filled 2014-11-05 (×2): qty 2
  Filled 2014-11-05: qty 12
  Filled 2014-11-05: qty 2
  Filled 2014-11-05: qty 12
  Filled 2014-11-05: qty 2

## 2014-11-05 MED ORDER — OLANZAPINE 2.5 MG PO TABS
2.5000 mg | ORAL_TABLET | Freq: Every day | ORAL | Status: DC
  Administered 2014-11-05: 2.5 mg via ORAL
  Filled 2014-11-05: qty 3
  Filled 2014-11-05 (×2): qty 1

## 2014-11-05 MED ORDER — CLONAZEPAM 0.5 MG PO TABS
0.5000 mg | ORAL_TABLET | Freq: Two times a day (BID) | ORAL | Status: DC
  Administered 2014-11-05 – 2014-11-06 (×2): 0.5 mg via ORAL
  Filled 2014-11-05 (×2): qty 1

## 2014-11-05 NOTE — [facility] Group Notes (Signed)
[facility]   11/05/2014 10:45 AM  Participation Quality:   Patient did not attend group.   Wynn BankerHodnett, Kelyn Koskela Hairston   11/05/2014     10:45 AM

## 2014-11-05 NOTE — Progress Notes (Addendum)
Patient in bed resting at the beginning of the shift. He denied SI/HI, denied withdrawal symptoms and denied Hallucinations. Although his mood and affects sad and depressed. He continues to complaint of anxiety. Writer encouraged and supported patient. Q 15 minute check continues as ordered to maintain safety.

## 2014-11-05 NOTE — Progress Notes (Signed)
Pt stated he has not been sleeping good. He did state that ,"It was very selfish of me to try to kill myself.I have a 40 year old son." Pt does contract for safety but does appear blunted and flat in his affect. Pt stated,'I just need to go back to bed now."

## 2014-11-05 NOTE — Progress Notes (Signed)
Recreation Therapy Notes  Date: 03.23.2016 Time: 9:30am Location: [address] Day[address]   Group Topic: Stress Management  Goal Area(s) Addresses:  Patient will actively participate in stress management techniques presented during session.   Behavioral Response: Did not attend.   Tashaun Obey L Lya Holben, LRT/CTRS  Angle Dirusso L 11/05/2014 4:09 PM 

## 2014-11-05 NOTE — Progress Notes (Signed)
Patient ID: Tyler Lambert, male   DOB: August 03, 1976, 39 y.o.   MRN: [id number] [facility] MD Progress Note  11/05/2014 10:40 AM KYRESE GARTMAN  MRN:  [id number]   Subjective:  He states that he is very tired today and having difficulty getting up due to feeling tired. He does state that his nxiety is better, was able to attend some groups yesterday with less anxiety/no distress.  He denies medication side effects and does think that medications are working.   Objective : I have discussed case with treatment team and have met with patient.  As discussed with staff patient's mood seems partially improved although still depressed. He has chronic and severe anxiety symptoms which are decreasing and as noted he has been less isolated. Increased sedation may be related to current zyprexa dose. And of note pt has no clear hx of psychosis or bipolar, but does report that zyprexa has vbeen very helpful in decreasing anxiety.  Patient has long hx of social anxiety which has impacted his ability to interact in groups.  No disruptive behaviors on unit.  No side effects at this time except possible increased sedation due to zyprexa trial.    Principal Problem: Major depressive disorder, recurrent episode, severe Diagnosis:   Patient Active Problem List   Diagnosis Date Noted  . Major depressive disorder, recurrent, severe without psychotic features [F33.2]   . Adjustment disorder with depressed mood [F43.21]   . Major depressive disorder, recurrent episode, severe [F33.2] 11/01/2014  . MDD (major depressive disorder) [F32.2] 03/26/2013  . ADHD (attention deficit hyperactivity disorder) [F90.9] 03/26/2013  . Anxiety [F41.9]   . Depression [F32.9]   . Acid reflux [K21.9]   . Irritable bowel syndrome [K58.9]    Total Time spent with patient: 20 minutes   Past Medical History:  Past Medical History  Diagnosis Date  . Anxiety   . Depression   . Acid reflux   . Irritable bowel syndrome   . PTSD  (post-traumatic stress disorder)   . Bipolar 1 disorder   . ADHD (attention deficit hyperactivity disorder)   . Insomnia     Past Surgical History  Procedure Laterality Date  . Upper gastrointestinal endoscopy     Family History:  Family History  Problem Relation Age of Onset  . Heart failure Father    Social History:  History  Alcohol Use No    Comment: Reports had an issue in the past     History  Drug Use No    Comment: Denies drug use but positive cocaine; denied after confronted with result    History   Social History  . Marital Status: Legally Separated    Spouse Name: N/A  . Number of Children: N/A  . Years of Education: N/A   Social History Main Topics  . Smoking status: Never Smoker   . Smokeless tobacco: Current User    Types: Snuff  . Alcohol Use: No     Comment: Reports had an issue in the past  . Drug Use: No     Comment: Denies drug use but positive cocaine; denied after confronted with result  . Sexual Activity: Not on file   Other Topics Concern  . None   Social History Narrative   Additional History:    Sleep: Fair  Appetite: Improved   Assessment:   Musculoskeletal: Strength & Muscle Tone: within normal limits Gait & Station: normal Patient leans: N/A   Psychiatric Specialty Exam: Physical Exam  Constitutional: He is  oriented to person, place, and time.  Neck: Normal range of motion.  Respiratory: Effort normal.  Musculoskeletal: Normal range of motion.  Neurological: He is alert and oriented to person, place, and time.  Psychiatric: His mood appears anxious. He is withdrawn. He exhibits a depressed mood.    Review of Systems  Neurological: Negative for headaches.    Blood pressure 131/87, pulse 111, temperature 97.9 F (36.6 C), temperature source Oral, resp. rate 18, height '5\' 8"'  (1.727 m), weight 177 lb (80.287 kg).Body mass index is 26.92 kg/(m^2).  General Appearance: Fairly Groomed  Engineer, water::  Good  Speech:   Normal Rate  Volume:  Normal  Mood:  less severely depressed, fuller range of affect   Affect:  less constricted, less anxious   Thought Process:  Goal Directed and Linear  Orientation:  Full (Time, Place, and Person)  Thought Content:  No hallucinations, no delusions, not internally preoccupied  Suicidal Thoughts:  No Denies  Any thoughts of hurting self at this time  Homicidal Thoughts:  No  Memory:  recent and remote grossly intact   Judgement:  Fair  Insight:  Fair  Psychomotor Activity:  Normal, no psychomotor agitation or restlessness   Concentration:  Fair  Recall:  Good  Fund of Knowledge:Fair  Language: Good  Akathisia:  No  Handed:  Right  AIMS (if indicated):     Assets:  Communication Skills Desire for Improvement Housing Social Support  ADL's:  Intact  Cognition: WNL  Sleep:  Number of Hours: 5.5     Current Medications: Current Facility-Administered Medications  Medication Dose Route Frequency Provider Last Rate Last Dose  . acetaminophen (TYLENOL) tablet 650 mg  650 mg Oral Q6H PRN Harriet Butte, NP   650 mg at 11/04/14 1658  . alum & mag hydroxide-simeth (MAALOX/MYLANTA) 200-200-20 MG/5ML suspension 30 mL  30 mL Oral Q4H PRN Harriet Butte, NP      . clonazePAM (KLONOPIN) tablet 0.5 mg  0.5 mg Oral TID Jenne Campus, MD   0.5 mg at 11/05/14 0817  . feeding supplement (ENSURE ENLIVE) (ENSURE ENLIVE) liquid 237 mL  237 mL Oral BID BM Clayton Bibles, RD   237 mL at 11/04/14 1000  . FLUoxetine (PROZAC) capsule 20 mg  20 mg Oral Daily Shuvon B Rankin, NP   20 mg at 11/05/14 0817  . gabapentin (NEURONTIN) capsule 100 mg  100 mg Oral BID Jenne Campus, MD   100 mg at 11/05/14 0818  . hydrOXYzine (ATARAX/VISTARIL) tablet 25 mg  25 mg Oral Q6H PRN Shuvon B Rankin, NP   25 mg at 11/04/14 2153  . magnesium hydroxide (MILK OF MAGNESIA) suspension 30 mL  30 mL Oral Daily PRN Harriet Butte, NP      . multivitamin with minerals tablet 1 tablet  1 tablet Oral Daily  Shuvon B Rankin, NP   1 tablet at 11/05/14 0818  . nicotine polacrilex (NICORETTE) gum 2 mg  2 mg Oral PRN Jenne Campus, MD   2 mg at 11/04/14 1507  . OLANZapine (ZYPREXA) tablet 5 mg  5 mg Oral QHS Shuvon B Rankin, NP   5 mg at 11/04/14 2153    Lab Results:  No results found for this or any previous visit (from the past 48 hour(s)).  Physical Findings: AIMS: Facial and Oral Movements Muscles of Facial Expression: None, normal Lips and Perioral Area: None, normal Jaw: None, normal Tongue: None, normal,Extremity Movements Upper (arms, wrists, hands, fingers): None,  normal Lower (legs, knees, ankles, toes): None, normal, Trunk Movements Neck, shoulders, hips: None, normal, Overall Severity Severity of abnormal movements (highest score from questions above): None, normal Incapacitation due to abnormal movements: None, normal Patient's awareness of abnormal movements (rate only patient's report): No Awareness, Dental Status Current problems with teeth and/or dentures?: No Does patient usually wear dentures?: No  CIWA:  CIWA-Ar Total: 0 COWS:      Assessment - today patient reporting some improvement particularly not as severely depressed and less anxous. Better able to participate in milieu and less isolated. Still presents depressed and affect although improved remains constricted. Reports AM sedation which may be related to zyprexa. Does not however want to stop zyprexa completely as he feels it is helping his night time anxiety and helping sleep. We have reviewed medication side effects.  Agreed to increase neurontin to address chronic back pain and anxiety. Will taper Zyprexa down to address AM sedation. Will continue Prozac.   Treatment Plan Summary: Daily contact with patient to assess and evaluate symptoms and progress in treatment and Medication management Continue Prozac 20 mgrs QDAY  Decrease Zyprexa to 2.5 mgrs QHS Decrease Klonipin to 0.5 mg q12 hours for anxiety Increase  Neurontin to 200 mg BID    Medical Decision Making:  Established Problem, Stable/Improving (1), Review of Psycho-Social Stressors (1), Review of Last Therapy Session (1), Review of Medication Regimen & Side Effects (2) and Review of New Medication or Change in Dosage (2)   COBOS, FERNANDO,  11/05/2014, 10:40 AM

## 2014-11-05 NOTE — [facility] Group Notes (Signed)
[facility] LCSW Group Therapy  Emotional Regulation 1:15 - 2: 30 PM        11/05/2014     Type of Therapy:  Group Therapy  Participation Level:  Appropriate  Participation Quality:  Appropriate  Affect:  Depressed, Irritable  Cognitive:  Attentive Appropriate  Insight:  Developing/Improving Engaged  Engagement in Therapy:  Developing/Improving Engaged  Modes of Intervention:  Discussion Exploration Problem-Solving Supportive  Summary of Progress/Problems:  Group topic was emotional regulations.  Patient participated in the discussion and was able to identify an emotion that needed to regulated as guilt and shame.  He shared he is concerned about his 76seven year old son seeing him so down.  He also shared son is on ADHD medication and feels he is the cause.    Patient advised son being on medications gives him a good start to do well in school.  Wynn BankerHodnett, Roisin Mones Hairston 11/05/2014

## 2014-11-05 NOTE — Progress Notes (Signed)
Recreation Therapy Notes  Animal-Assisted Activity/Therapy (AAA/T) Program Checklist/Progress Notes Patient Eligibility Criteria Checklist & Daily Group note for Rec Tx Intervention  Date: 03.22.2016 Time: 2:45pm [address]    AAA/T Program Assumption of Risk Form signed by Patient/ or Parent Legal Guardian yes  Patient is free of allergies or sever asthma yes  Patient reports no fear of animals yes  Patient reports no history of cruelty to animals yes  Patient understands his/her participation is voluntary yes  Patient washes hands before animal contact yes  Patient washes hands after animal contact yes  Behavioral Response: Appropriate  Education: Hand Washing, Appropriate Animal Interaction   Education Outcome: Acknowledges education.   Clinical Observations/Feedback: Patient interacted appropriately with therapy dog team and peers during session. Patient asked appropriate questions about therapy dog and his training.   Marykay Lexenise L Ruthann Angulo, LRT/CTRS  Chalisa Kobler L 11/05/2014 8:22 AM

## 2014-11-06 ENCOUNTER — Encounter (HOSPITAL_COMMUNITY): Payer: Self-pay | Admitting: Registered Nurse

## 2014-11-06 MED ORDER — CLONAZEPAM 0.5 MG PO TABS: 0.5000 mg | ORAL_TABLET | Freq: Two times a day (BID) | ORAL | Status: DC

## 2014-11-06 MED ORDER — OLANZAPINE 2.5 MG PO TABS: 2.5000 mg | ORAL_TABLET | Freq: Every day | ORAL | Status: DC

## 2014-11-06 MED ORDER — ZOLPIDEM TARTRATE 5 MG PO TABS
5.0000 mg | ORAL_TABLET | Freq: Every evening | ORAL | Status: DC | PRN
  Administered 2014-11-06: 5 mg via ORAL

## 2014-11-06 MED ORDER — FLUOXETINE HCL 20 MG PO CAPS: 20.0000 mg | ORAL_CAPSULE | Freq: Every day | ORAL | Status: DC

## 2014-11-06 MED ORDER — ZOLPIDEM TARTRATE 5 MG PO TABS
ORAL_TABLET | ORAL | Status: AC
  Filled 2014-11-06: qty 1

## 2014-11-06 MED ORDER — ZOLPIDEM TARTRATE 5 MG PO TABS: 5.0000 mg | ORAL_TABLET | Freq: Every evening | ORAL | Status: DC | PRN

## 2014-11-06 MED ORDER — GABAPENTIN 100 MG PO CAPS: 200.0000 mg | ORAL_CAPSULE | Freq: Two times a day (BID) | ORAL | Status: DC

## 2014-11-06 NOTE — Progress Notes (Addendum)
D: Pt d/c home as ordered. Pt was cooperative with unit routines and schedules.  A: 1:1 contact made with pt to conduct shift assessment. D/C instructions reviewed with pt. Medication education (dose and times) done with pt as well. All belongings in locker 30 given to pt. Medication samples and scripts was handed to pt in lobby at time of departure. Emotional support and encouragement provided to pt. Safety maintained on Q 15 minutes checks as ordered till time of D/C.  R: Pt presents with flat affect and depressed mood. Pt attended and participated in scheduled nursing group this AM. Verbalized understanding on d/c instructions and medication education. Pt signed his belonging sheet in agreement with items received.  Pt denied SI / HI, AVH when assessed. Reported his back pain is 3/10 from 5/10 when assessed post Tylenol administration at 0814. Pt was picked up by his mother. Safety maintained till time of d/c from facility.

## 2014-11-06 NOTE — [facility] Suicide Risk Assessment (Signed)
[facility] Discharge Suicide Risk Assessment   Demographic Factors:  39 year old single male, lives at home with mother and 27 year old son, employed.    Total Time spent with patient: 30 minutes  Musculoskeletal: Strength & Muscle Tone: within normal limits Gait & Station: normal Patient leans: N/A  Psychiatric Specialty Exam: Physical Exam  ROS  Blood pressure 123/73, pulse 114, temperature 97.7 F (36.5 C), temperature source Oral, resp. rate 18, height  (1.727 m), weight 177 lb (80.287 kg).Body mass index is 26.92 kg/(m^2).  General Appearance: Well Groomed  Eye Contact::  Good  Speech:  Normal Rate409  Volume:  Normal  Mood:  improved  Affect:  less anxious, fuller in range  Thought Process:  Goal Directed and Linear  Orientation:  Full (Time, Place, and Person)  Thought Content:  no delusions, no hallucinations, not internally preoccupied   Suicidal Thoughts:  No  Homicidal Thoughts:  No  Memory:  recent and remote grossly intact  Judgement:  Good  Insight:  Present  Psychomotor Activity:  Normal  Concentration:  Good  Recall:  Good  Fund of Knowledge:Good  Language: Good  Akathisia:  Negative  Handed:  Right  AIMS (if indicated):     Assets:  Communication Skills Desire for Improvement Resilience Social Support Vocational/Educational  Sleep:  Number of Hours: 1.75  Cognition: WNL  ADL's:  Improved    Have you used any form of tobacco in the last 30 days? (Cigarettes, Smokeless Tobacco, Cigars, and/or Pipes): Yes  Has this patient used any form of tobacco in the last 30 days? (Cigarettes, Smokeless Tobacco, Cigars, and/or Pipes) No  Mental Status Per Nursing Assessment::   On Admission:  Self-harm thoughts  Current Mental Status by Physician: At this time patient is alert, attentive, mood improved, less anxious, states that he feels "much better". Affect is still slightly constricted and does smile at times appropriately. Denies SI/HI. No psychotic symptoms.  He is future oriented looking forward to reuniting with loved ones.   Loss Factors: Chronic anxiety  Historical Factors: History of depression and anxiety  Risk Reduction Factors:   Responsible for children under 48 years of age, Employed, Living with another person, especially a relative and Positive coping skills or problem solving skills  Continued Clinical Symptoms:  As above, improved compared to admission with no SI/HI and future oriented.   Cognitive Features That Contribute To Risk:  No gross cognitive deficits noted upon discharge. Is alert , attentive, and oriented x 3    Suicide Risk:  Mild:  Suicidal ideation of limited frequency, intensity, duration, and specificity.  There are no identifiable plans, no associated intent, mild dysphoria and related symptoms, good self-control (both objective and subjective assessment), few other risk factors, and identifiable protective factors, including available and accessible social support.  Principal Problem: Major depressive disorder, recurrent episode, severe Discharge Diagnoses:  Patient Active Problem List   Diagnosis Date Noted  . Major depressive disorder, recurrent, severe without psychotic features [F33.2]   . Adjustment disorder with depressed mood [F43.21]   . Major depressive disorder, recurrent episode, severe [F33.2] 11/01/2014  . MDD (major depressive disorder) [F32.2] 03/26/2013  . ADHD (attention deficit hyperactivity disorder) [F90.9] 03/26/2013  . Anxiety [F41.9]   . Depression [F32.9]   . Acid reflux [K21.9]   . Irritable bowel syndrome [K58.9]     Follow-up Information    Follow up with [facility]. Schedule an appointment as soon as possible for a visit on 11/12/2014.  Why:  Jenetta DownerStephanie Kuehne - Wednesday November 12, 2014 at 10AM  for therapy and Dr. [name] [facility]   Contact information:   [facility] [address] [place]  [address] Telephone:  [phone]       Plan Of Care/Follow-up recommendations:  Activity:  As tolerated  Diet:  Regular  Tests:  N/A Other:  see below   Is patient on multiple antipsychotic therapies at discharge:  No   Has Patient had three or more failed trials of antipsychotic monotherapy by history:  No   Patient is leaving unit in good spirits, returning home.  Follow-up as above.   Recommended Plan for Multiple Antipsychotic Therapies:  NA    COBOS, FERNANDO 11/06/2014, 9:53 AM

## 2014-11-06 NOTE — Discharge Summary (Signed)
Physician Discharge Summary Note  Patient:  Tyler Lambert is an 39 y.o., male MRN:  [id number] DOB:  21-Jun-1976 Patient phone:  [phone] (home)  Patient address:   [address] [place] [address],  Total Time spent with patient: Greater than 30 minutes  Date of Admission:  11/01/2014 Date of Discharge: 11/06/2014  Reason for Admission:  Per H&P Admission:  Principal Problem: Major depressive disorder, recurrent episode, severe Discharge Diagnoses: Patient Active Problem List   Diagnosis Date Noted  . Major depressive disorder, recurrent, severe without psychotic features [F33.2]   . Adjustment disorder with depressed mood [F43.21]   . Major depressive disorder, recurrent episode, severe [F33.2] 11/01/2014  . MDD (major depressive disorder) [F32.2] 03/26/2013  . ADHD (attention deficit hyperactivity disorder) [F90.9] 03/26/2013  . Anxiety [F41.9]   . Depression [F32.9]   . Acid reflux [K21.9]   . Irritable bowel syndrome [K58.9]     Musculoskeletal: Strength & Muscle Tone: within normal limits Gait & Station: normal Patient leans: N/A  Psychiatric Specialty Exam:  See Suicide Risk Assessment Physical Exam  Constitutional: He is oriented to person, place, and time.  Neck: Normal range of motion.  Respiratory: Effort normal.  Neurological: He is alert and oriented to person, place, and time.    Review of Systems  Psychiatric/Behavioral: Negative for suicidal ideas, hallucinations and memory loss. Depression: Stable. Nervous/anxious: Stable. Insomnia: Stable.     Blood pressure 134/82, pulse 105, temperature 97.7 F (36.5 C), temperature source Oral, resp. rate 18, height 5\' 8"  (1.727 m), weight 80.287 kg (177 lb).Body mass index is 26.92 kg/(m^2).    Past Medical History:  Past Medical History  Diagnosis Date  . Anxiety   . Depression   . Acid reflux   . Irritable bowel syndrome   . PTSD (post-traumatic stress disorder)   . Bipolar 1 disorder   . ADHD  (attention deficit hyperactivity disorder)   . Insomnia     Past Surgical History  Procedure Laterality Date  . Upper gastrointestinal endoscopy     Family History:  Family History  Problem Relation Age of Onset  . Heart failure Father    Social History:  History  Alcohol Use No    Comment: Reports had an issue in the past     History  Drug Use No    Comment: Denies drug use but positive cocaine; denied after confronted with result    History   Social History  . Marital Status: Legally Separated    Spouse Name: N/A  . Number of Children: N/A  . Years of Education: N/A   Social History Main Topics  . Smoking status: Never Smoker   . Smokeless tobacco: Current User    Types: Snuff  . Alcohol Use: No     Comment: Reports had an issue in the past  . Drug Use: No     Comment: Denies drug use but positive cocaine; denied after confronted with result  . Sexual Activity: Not on file   Other Topics Concern  . None   Social History Narrative     Risk to Self: Is patient at risk for suicide?: Yes What has been your use of drugs/alcohol within the last 12 months?: Cocaine (powder) was used right before coming into hospital.  Alcohol was a problem for a long time, because it eased his anxiety - has not drank since October 2014.  Has not used marijuana for years. Risk to Others:   Prior Inpatient Therapy:  Prior Outpatient Therapy:    Level of Care:  OP  Hospital Course:  ANAS REISTER was admitted for Major depressive disorder, recurrent episode, severe and crisis management.  He was treated discharged with the medications listed below under Medication List.  Medical problems were identified and treated as needed.  Home medications were restarted as appropriate.  Improvement was monitored by observation and Coralee North Deutschman daily report of symptom reduction.  Emotional and mental status was monitored by daily self-inventory reports completed by Tyler Nan and clinical  staff.         Coralee North Boggan was evaluated by the treatment team for stability and plans for continued recovery upon discharge.  Coralee North Hulen motivation was an integral factor for scheduling further treatment.  Employment, transportation, bed availability, health status, family support, and any pending legal issues were also considered during his hospital stay.  He was offered further treatment options upon discharge including but not limited to Residential, Intensive Outpatient, and Outpatient treatment.  Shonn Farruggia Dimascio will follow up with the services as listed below under Follow Up Information.     Upon completion of this admission the patient was both mentally and medically stable for discharge denying suicidal/homicidal ideation, auditory/visual/tactile hallucinations, delusional thoughts and paranoia.      Consults:  psychiatry  Significant Diagnostic Studies:  labs: Lipid panel, HgbA1c, CBC, CMET, ETOH, UDS, Urinalysis  Discharge Vitals:   Blood pressure 134/82, pulse 105, temperature 97.7 F (36.5 C), temperature source Oral, resp. rate 18, height  (1.727 m), weight 80.287 kg (177 lb). Body mass index is 26.92 kg/(m^2). Lab Results:   No results found for this or any previous visit (from the past 72 hour(s)).  Physical Findings: AIMS: Facial and Oral Movements Muscles of Facial Expression: None, normal Lips and Perioral Area: None, normal Jaw: None, normal Tongue: None, normal,Extremity Movements Upper (arms, wrists, hands, fingers): None, normal Lower (legs, knees, ankles, toes): None, normal, Trunk Movements Neck, shoulders, hips: None, normal, Overall Severity Severity of abnormal movements (highest score from questions above): None, normal Incapacitation due to abnormal movements: None, normal Patient's awareness of abnormal movements (rate only patient's report): No Awareness, Dental Status Current problems with teeth and/or dentures?: No Does patient usually wear  dentures?: No  CIWA:  CIWA-Ar Total: 2 COWS:      See Psychiatric Specialty Exam and Suicide Risk Assessment completed by Attending Physician prior to discharge.  Discharge destination:  Home  Is patient on multiple antipsychotic therapies at discharge:  No   Has Patient had three or more failed trials of antipsychotic monotherapy by history:  No    Recommended Plan for Multiple Antipsychotic Therapies: NA      Discharge Instructions    Activity as tolerated - No restrictions    Complete by:  As directed      Diet general    Complete by:  As directed      Discharge instructions    Complete by:  As directed   Take all of you medications as prescribed by your mental healthcare provider.  Report any adverse effects and reactions from your medications to your outpatient provider promptly. Do not engage in alcohol and or illegal drug use while on prescription medicines. In the event of worsening symptoms call the crisis hotline, 911, and or go to the nearest emergency department for appropriate evaluation and treatment of symptoms. Follow-up with your primary care provider for your medical issues, concerns and or health care needs.  Keep all scheduled appointments.  If you are unable to keep an appointment call to reschedule.  Let the nurse know if you will need medications before next scheduled appointment.            Medication List    STOP taking these medications        ALPRAZolam 1 MG tablet  Commonly known as:  XANAX     ARIPiprazole 15 MG tablet  Commonly known as:  ABILIFY     diazepam 5 MG tablet  Commonly known as:  VALIUM     DOXEPIN HCL PO     methocarbamol 500 MG tablet  Commonly known as:  ROBAXIN     naproxen 500 MG tablet  Commonly known as:  NAPROSYN     pantoprazole 20 MG tablet  Commonly known as:  PROTONIX     PARoxetine 40 MG tablet  Commonly known as:  PAXIL      TAKE these medications      Indication   clonazePAM 0.5 MG tablet   Commonly known as:  KLONOPIN  Take 1 tablet (0.5 mg total) by mouth 2 (two) times daily.   Indication:  anxiety     diphenhydramine-acetaminophen 25-500 MG Tabs  Commonly known as:  TYLENOL PM  Take 4 tablets by mouth at bedtime as needed (sleep).      FLUoxetine 20 MG capsule  Commonly known as:  PROZAC  Take 1 capsule (20 mg total) by mouth daily. For depression   Indication:  Depression     gabapentin 100 MG capsule  Commonly known as:  NEURONTIN  Take 2 capsules (200 mg total) by mouth 2 (two) times daily. For aggressive behavior/agitation   Indication:  Aggressive Behavior, Agitation     HYDROcodone-acetaminophen 5-325 MG per tablet  Commonly known as:  NORCO/VICODIN  Take 1-2 tablets by mouth every 6 (six) hours as needed.      OLANZapine 2.5 MG tablet  Commonly known as:  ZYPREXA  Take 1 tablet (2.5 mg total) by mouth at bedtime. For mood control   Indication:  mood control     traMADol 50 MG tablet  Commonly known as:  ULTRAM  Take 1 tablet (50 mg total) by mouth every 6 (six) hours as needed for pain.      zolpidem 5 MG tablet  Commonly known as:  AMBIEN  Take 1 tablet (5 mg total) by mouth at bedtime and may repeat dose one time if needed. For sleep   Indication:  Trouble Sleeping       Follow-up Information    Follow up with [facility]. Schedule an appointment as soon as possible for a visit on 11/12/2014.   Why:  [name] - Wednesday November 12, 2014 at 10AM  for therapy and Dr. Omelia Blackwater at [facility] information:   [facility] [address] [place]  [address] Telephone:  [phone]      Follow-up recommendations:  Activity:  As tolerated Diet:  As tolerated  Comments:   Patient has been instructed to take medications as prescribed; and report adverse effects to outpatient provider.  Follow up with primary doctor for any medical issues and If symptoms recur report to nearest emergency or crisis  hot line.    Total Discharge Time: Greater than 30 minutes  Signed: Assunta Found, FNP-BC 11/06/2014, 1:37 PM    Patient seen, Suicide Assessment Completed.  Disposition Plan Reviewed

## 2014-11-10 NOTE — Progress Notes (Signed)
Patient Discharge Instructions:  After Visit Summary (AVS):   Faxed to:  11/10/14 Discharge Summary Note:   Faxed to:  11/10/14 Psychiatric Admission Assessment Note:   Faxed to:  11/10/14 Suicide Risk Assessment - Discharge Assessment:   Faxed to:  11/10/14 Faxed/Sent to the Next Level Care provider:  11/10/14 [facility] to [facility] @ [phone]  Jerelene ReddenSheena E [place], 11/10/2014, 3:50 PM

## 2014-11-14 NOTE — Progress Notes (Signed)
  [facility]    Late Entry for 11/06/14  Will you be returning to the same living situation after discharge:  Yes,  Patient returning to his home. At discharge, do you have transportation home?: Yes,  Patient was arranging transportation home. Do you have the ability to pay for your medications: No.  Patient needed assistance for indigent medications.  Release of information consent forms completed and in the chart;  Patient's signature needed at discharge.  Patient to Follow up at: Follow-up Information    Follow up with [facility]. Schedule an appointment as soon as possible for a visit on 11/12/2014.   Why:  Tyler DownerStephanie Lambert - Wednesday November 12, 2014 at 10AM  for therapy and Dr. Omelia Lambert at [facility]   Contact information:   [facility] [address] [place]  [address] Telephone:  [phone]      Patient denies SI/HI: Patient was not endorsing SI/HI at time of discharge.    Safety Planning and Suicide Prevention discussed: .Reviewed with all patients during discharge planning group   Have you used any form of tobacco in the last 30 days? (Cigarettes, Smokeless Tobacco, Cigars, and/or Pipes): Yes  Has patient been referred to the Quitline?: Patient declined referral to Quitline.   Tyler Lambert, Tyler Lambert 11/14/2014, 12:55 PM

## 2016-01-18 ENCOUNTER — Inpatient Hospital Stay (HOSPITAL_COMMUNITY)
Admission: AD | Admit: 2016-01-18 | Discharge: 2016-01-22 | DRG: 885 | Disposition: A | Discharge status: Home / Self Care | Payer: Medicare Other | Source: Intra-hospital | Attending: Psychiatry | Admitting: Psychiatry

## 2016-01-18 ENCOUNTER — Emergency Department (HOSPITAL_COMMUNITY)
Admission: EM | Admit: 2016-01-18 | Discharge: 2016-01-18 | Disposition: A | Discharge status: Other Acute Inpatient Hospital | Payer: Medicare Other | Attending: Emergency Medicine | Admitting: Emergency Medicine

## 2016-01-18 ENCOUNTER — Encounter (HOSPITAL_COMMUNITY): Payer: Self-pay | Admitting: *Deleted

## 2016-01-18 ENCOUNTER — Encounter (HOSPITAL_COMMUNITY): Payer: Self-pay | Admitting: Emergency Medicine

## 2016-01-18 DIAGNOSIS — F332 Major depressive disorder, recurrent severe without psychotic features: Secondary | ICD-10-CM | POA: Diagnosis present

## 2016-01-18 DIAGNOSIS — F909 Attention-deficit hyperactivity disorder, unspecified type: Secondary | ICD-10-CM | POA: Diagnosis present

## 2016-01-18 DIAGNOSIS — Z79899 Other long term (current) drug therapy: Secondary | ICD-10-CM | POA: Diagnosis not present

## 2016-01-18 DIAGNOSIS — F4310 Post-traumatic stress disorder, unspecified: Secondary | ICD-10-CM | POA: Diagnosis present

## 2016-01-18 DIAGNOSIS — Z8249 Family history of ischemic heart disease and other diseases of the circulatory system: Secondary | ICD-10-CM

## 2016-01-18 DIAGNOSIS — Z818 Family history of other mental and behavioral disorders: Secondary | ICD-10-CM | POA: Diagnosis not present

## 2016-01-18 DIAGNOSIS — F17220 Nicotine dependence, chewing tobacco, uncomplicated: Secondary | ICD-10-CM | POA: Diagnosis present

## 2016-01-18 DIAGNOSIS — G4700 Insomnia, unspecified: Secondary | ICD-10-CM | POA: Diagnosis present

## 2016-01-18 DIAGNOSIS — K219 Gastro-esophageal reflux disease without esophagitis: Secondary | ICD-10-CM | POA: Diagnosis present

## 2016-01-18 DIAGNOSIS — R45851 Suicidal ideations: Secondary | ICD-10-CM | POA: Diagnosis present

## 2016-01-18 LAB — SALICYLATE LEVEL: Salicylate Lvl: <4 mg/dL (ref 2.8–30.0)

## 2016-01-18 LAB — CBC
HEMATOCRIT: 34.5 % — AB (ref 39.0–52.0)
HEMOGLOBIN: 12 g/dL — AB (ref 13.0–17.0)
MCH: 29.5 pg (ref 26.0–34.0)
MCHC: 34.8 g/dL (ref 30.0–36.0)
MCV: 84.8 fL (ref 78.0–100.0)
Platelets: 213 10*3/uL (ref 150–400)
RBC: 4.07 MIL/uL — ABNORMAL LOW (ref 4.22–5.81)
RDW: 13.5 % (ref 11.5–15.5)
WBC: 11.5 10*3/uL — AB (ref 4.0–10.5)

## 2016-01-18 LAB — RAPID URINE DRUG SCREEN, HOSP PERFORMED
AMPHETAMINES: NOT DETECTED
BARBITURATES: NOT DETECTED
Benzodiazepines: POSITIVE — AB
COCAINE: POSITIVE — AB
Opiates: NOT DETECTED
TETRAHYDROCANNABINOL: NOT DETECTED

## 2016-01-18 LAB — ACETAMINOPHEN LEVEL

## 2016-01-18 LAB — COMPREHENSIVE METABOLIC PANEL
ALBUMIN: 4.1 g/dL (ref 3.5–5.0)
ALK PHOS: 43 U/L (ref 38–126)
ALT: 17 U/L (ref 17–63)
ANION GAP: 6 (ref 5–15)
AST: 19 U/L (ref 15–41)
BUN: 19 mg/dL (ref 6–20)
CO2: 26 mmol/L (ref 22–32)
Calcium: 8.7 mg/dL — ABNORMAL LOW (ref 8.9–10.3)
Chloride: 110 mmol/L (ref 101–111)
Creatinine, Ser: 0.89 mg/dL (ref 0.61–1.24)
GFR calc Af Amer: >60 mL/min (ref >60)
GFR calc non Af Amer: >60 mL/min (ref >60)
GLUCOSE: 83 mg/dL (ref 65–99)
POTASSIUM: 3.7 mmol/L (ref 3.5–5.1)
SODIUM: 142 mmol/L (ref 135–145)
Total Bilirubin: 0.9 mg/dL (ref 0.3–1.2)
Total Protein: 7.1 g/dL (ref 6.5–8.1)

## 2016-01-18 LAB — ETHANOL: Alcohol, Ethyl (B): <5 mg/dL (ref <5)

## 2016-01-18 MED ORDER — OLANZAPINE 2.5 MG PO TABS: 2.5000 mg | ORAL_TABLET | Freq: Every day | ORAL | Status: DC

## 2016-01-18 MED ORDER — ACETAMINOPHEN 500 MG PO TABS: 1000.0000 mg | ORAL_TABLET | Freq: Once | ORAL | Status: DC

## 2016-01-18 MED ORDER — MAGNESIUM HYDROXIDE 400 MG/5ML PO SUSP: 30.0000 mL | Freq: Every day | ORAL | Status: DC | PRN

## 2016-01-18 MED ORDER — SALINE SPRAY 0.65 % NA SOLN
1.0000 | NASAL | Status: DC | PRN
  Administered 2016-01-18 – 2016-01-21 (×5): 1 via NASAL
  Filled 2016-01-18 (×2): qty 44

## 2016-01-18 MED ORDER — IBUPROFEN 800 MG PO TABS: 800.0000 mg | ORAL_TABLET | Freq: Once | ORAL | Status: DC

## 2016-01-18 MED ORDER — CLONAZEPAM 0.5 MG PO TABS
0.5000 mg | ORAL_TABLET | Freq: Two times a day (BID) | ORAL | Status: DC
  Administered 2016-01-18: 0.5 mg via ORAL
  Filled 2016-01-18: qty 1

## 2016-01-18 MED ORDER — CLONAZEPAM 0.5 MG PO TABS
0.5000 mg | ORAL_TABLET | Freq: Two times a day (BID) | ORAL | Status: DC
  Administered 2016-01-18 – 2016-01-19 (×2): 0.5 mg via ORAL
  Filled 2016-01-18 (×2): qty 1

## 2016-01-18 MED ORDER — GI COCKTAIL ~~LOC~~: 30.0000 mL | Freq: Once | ORAL | Status: DC

## 2016-01-18 MED ORDER — ACETAMINOPHEN 325 MG PO TABS
650.0000 mg | ORAL_TABLET | Freq: Four times a day (QID) | ORAL | Status: DC | PRN
  Administered 2016-01-21: 650 mg via ORAL
  Filled 2016-01-18: qty 2

## 2016-01-18 MED ORDER — ALUM & MAG HYDROXIDE-SIMETH 200-200-20 MG/5ML PO SUSP
30.0000 mL | ORAL | Status: DC | PRN
  Administered 2016-01-19: 30 mL via ORAL
  Filled 2016-01-18: qty 30

## 2016-01-18 MED ORDER — OLANZAPINE 2.5 MG PO TABS
2.5000 mg | ORAL_TABLET | Freq: Every day | ORAL | Status: DC
  Administered 2016-01-18: 2.5 mg via ORAL
  Filled 2016-01-18 (×3): qty 1

## 2016-01-18 NOTE — Progress Notes (Signed)
Adult Psychoeducational Group Note  Date:  01/18/2016 Time:  8:55 PM  Group Topic/Focus:  Wrap-Up Group:   The focus of this group is to help patients review their daily goal of treatment and discuss progress on daily workbooks.  Participation Level:  Did Not Attend  Participation Quality:  Did not attend  Affect:  Did not attend  Cognitive:  Did not attend  Insight: None  Engagement in Group:  Did not attend  Modes of Intervention:  Did not attend  Additional Comments:  Patient did not attend wrap up group tonight.   Bradrick Kamau L Ashly Yepez 01/18/2016, 8:55 PM

## 2016-01-18 NOTE — Progress Notes (Signed)
D:Patient in the bed on approach.  Patient states he is resting and that he is tired.  Patient states his main complaint is congested.  Patient states he does not have a goal at this time.  Patient does not engage in conversation with Clinical research associatewriter.  Patient states he is passive SI but verbally contracts for safety.  Patient denies HI and denies AVH. A: Staff to monitor Q 15 mins for safety.  Encouragement and support offered.  Scheduled medications administered per orders.  Nasal spray administered prn for congestion. R: Patient remains safe on the unit.  Patient did not attend group tonight.  Patient not visible on the unit tonight.  Patient taking administered medications

## 2016-01-18 NOTE — ED Notes (Signed)
Patient asked for some fluids to help facilitate urine sample. Gave patient sandwich and fluids. Patient has been wanded by security and has 2 bags of belongings at the nurses station.

## 2016-01-18 NOTE — ED Provider Notes (Signed)
CSN: [id number]     Arrival date & time 01/18/16  1034 History   First MD Initiated Contact with Patient 01/18/16 1211     Chief Complaint  Patient presents with  . Suicidal     (Consider location/radiation/quality/duration/timing/severity/associated sxs/prior Treatment) Patient is a 40 y.o. male presenting with mental health disorder. The history is provided by the patient.  Mental Health Problem Presenting symptoms: suicidal thoughts and suicidal threats   Degree of incapacity (severity):  Moderate Onset quality:  Gradual Duration:  2 days Timing:  Constant Progression:  Worsening Chronicity:  Recurrent Context: stressful life event   Relieved by:  Nothing Worsened by:  Nothing tried Ineffective treatments:  None tried Associated symptoms: no abdominal pain, no chest pain and no headaches    40 yo M With a chief complaint of suicidal ideation. Patient plans on going into the woods and slitting his wrists. He has tried this in the past. He is recently about to move in with his girlfriend and he is concerned about this and this is made more suicidal. Has not been compliant with his medications for the past month and a half. He states he did not trust the physician that was prescribing them so he stopped them. He did think the medications were helping.  Past Medical History  Diagnosis Date  . Anxiety   . Depression   . Acid reflux   . Irritable bowel syndrome   . PTSD (post-traumatic stress disorder)   . Bipolar 1 disorder (HCC)   . ADHD (attention deficit hyperactivity disorder)   . Insomnia    Past Surgical History  Procedure Laterality Date  . Upper gastrointestinal endoscopy     Family History  Problem Relation Age of Onset  . Heart failure Father    Social History  Substance Use Topics  . Smoking status: Never Smoker   . Smokeless tobacco: Current User    Types: Snuff  . Alcohol Use: No     Comment: Reports had an issue in the past    Review of Systems   Constitutional: Negative for fever and chills.  HENT: Negative for congestion and facial swelling.   Eyes: Negative for discharge and visual disturbance.  Respiratory: Negative for shortness of breath.   Cardiovascular: Negative for chest pain and palpitations.  Gastrointestinal: Negative for vomiting, abdominal pain and diarrhea.  Musculoskeletal: Negative for myalgias and arthralgias.  Skin: Negative for color change and rash.  Neurological: Negative for tremors, syncope and headaches.  Psychiatric/Behavioral: Positive for suicidal ideas. Negative for confusion and dysphoric mood.      Allergies  Review of patient's allergies indicates no known allergies.  Home Medications   Prior to Admission medications   Medication Sig Start Date End Date Taking? Authorizing Provider  clonazePAM (KLONOPIN) 0.5 MG tablet Take 1 tablet (0.5 mg total) by mouth 2 (two) times daily. Patient not taking: Reported on 01/18/2016 11/06/14   Shuvon B Rankin, NP  diphenhydramine-acetaminophen (TYLENOL PM) 25-500 MG TABS Take 4 tablets by mouth at bedtime as needed (sleep).    Historical Provider, MD  FLUoxetine (PROZAC) 20 MG capsule Take 1 capsule (20 mg total) by mouth daily. For depression Patient not taking: Reported on 01/18/2016 11/06/14   Shuvon B Rankin, NP  gabapentin (NEURONTIN) 100 MG capsule Take 2 capsules (200 mg total) by mouth 2 (two) times daily. For aggressive behavior/agitation Patient not taking: Reported on 01/18/2016 11/06/14   Shuvon B Rankin, NP  OLANZapine (ZYPREXA) 2.5 MG tablet Take 1 tablet (  2.5 mg total) by mouth at bedtime. For mood control Patient not taking: Reported on 01/18/2016 11/06/14   Shuvon B Rankin, NP  zolpidem (AMBIEN) 5 MG tablet Take 1 tablet (5 mg total) by mouth at bedtime and may repeat dose one time if needed. For sleep Patient not taking: Reported on 01/18/2016 11/06/14   Shuvon B Rankin, NP   BP 108/58 mmHg  Pulse 88  Temp(Src) 98.5 F (36.9 C) (Oral)  Resp 16   SpO2 97% Physical Exam  Constitutional: He is oriented to person, place, and time. He appears well-developed and well-nourished.  HENT:  Head: Normocephalic and atraumatic.  Eyes: EOM are normal. Pupils are equal, round, and reactive to light.  Neck: Normal range of motion. Neck supple. No JVD present.  Cardiovascular: Normal rate and regular rhythm.  Exam reveals no gallop and no friction rub.   No murmur heard. Pulmonary/Chest: No respiratory distress. He has no wheezes.  Abdominal: He exhibits no distension. There is no tenderness. There is no rebound and no guarding.  Musculoskeletal: Normal range of motion. He exhibits no edema or tenderness.  No tenderness on palpation of either knee. Full range of motion. No erythema or edema.  Neurological: He is alert and oriented to person, place, and time.  Skin: No rash noted. No pallor.  Psychiatric: He has a normal mood and affect. His behavior is normal.  Nursing note and vitals reviewed.   ED Course  Procedures (including critical care time) Labs Review Labs Reviewed  COMPREHENSIVE METABOLIC PANEL - Abnormal; Notable for the following:    Calcium 8.7 (*)    All other components within normal limits  ACETAMINOPHEN LEVEL - Abnormal; Notable for the following:    Acetaminophen (Tylenol), Serum <10 (*)    All other components within normal limits  CBC - Abnormal; Notable for the following:    WBC 11.5 (*)    RBC 4.07 (*)    Hemoglobin 12.0 (*)    HCT 34.5 (*)    All other components within normal limits  URINE RAPID DRUG SCREEN, HOSP PERFORMED - Abnormal; Notable for the following:    Cocaine POSITIVE (*)    Benzodiazepines POSITIVE (*)    All other components within normal limits  ETHANOL  SALICYLATE LEVEL    Imaging Review No results found. I have personally reviewed and evaluated these images and lab results as part of my medical decision-making.   EKG Interpretation None      MDM   Final diagnoses:  Major  depressive disorder, recurrent, severe without psychotic features [facility](HCC)    40 yo M with a chief complaint of suicidal ideation. TTS consult.  Will Admit.   The patients results and plan were reviewed and discussed.   Any x-rays performed were independently reviewed by myself.   Differential diagnosis were considered with the presenting HPI.  Medications - No data to display  Filed Vitals:   01/18/16 1047 01/18/16 1451  BP: 115/75 108/58  Pulse: 97 88  Temp: 99 F (37.2 C) 98.5 F (36.9 C)  TempSrc: Oral Oral  Resp: 18 16  SpO2: 100% 97%    Final diagnoses:  Major depressive disorder, recurrent, severe without psychotic features (HCC)    Admission/ observation were discussed with the admitting physician, patient and/or family and they are comfortable with the plan.    Melene Planan Leonilda Cozby, DO 01/18/16 1745

## 2016-01-18 NOTE — BH Assessment (Signed)
[facility] Assessment Progress Note  Per Nanine MeansJamison Lord, DNP, this pt requires psychiatric hospitalization at this time.  Berneice Heinrichina Tate, RN, [id number]  Pt has signed Voluntary Admission and Consent for Treatment, as well as Consent to Release Information to no one, and signed forms [facility].  Pt's nurse, Rudean HittDawnaly, has been notified, and agrees to send original paperwork along with pt via [facility], and to call report to [phone].  Doylene Canninghomas Lane Kjos, MA Triage Specialist [phone]

## 2016-01-18 NOTE — ED Notes (Signed)
Pt's mother called and reports that we may be receiving a call of the Pt's lawyer.  Sts the Pt is supposed to have a court date tomorrow.
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
## 2016-01-18 NOTE — Tx Team (Signed)
Initial Interdisciplinary Treatment Plan   PATIENT STRESSORS: Financial difficulties Legal issue Marital or family conflict Medication change or noncompliance   PATIENT STRENGTHS: Capable of independent living Manufacturing systems engineerCommunication skills Motivation for treatment/growth Physical Health Supportive family/friends   PROBLEM LIST: Problem List/Patient Goals Date to be addressed Date deferred Reason deferred Estimated date of resolution  At risk suicide 01/18/2016  01/18/2016   D/C  Depression 01/18/2016  01/18/2016   D/C  Anxiety 01/18/2016  01/18/2016   D/C  "Being around people" 01/18/2016  01/18/2016   D/C  "Idea's of killing myself" 01/18/2016  01/18/2016   D/C                           DISCHARGE CRITERIA:  Ability to meet basic life and health needs Improved stabilization in mood, thinking, and/or behavior Motivation to continue treatment in a less acute level of care Need for constant or close observation no longer present Reduction of life-threatening or endangering symptoms to within safe limits Verbal commitment to aftercare and medication compliance Withdrawal symptoms are absent or subacute and managed without 24-hour nursing intervention  PRELIMINARY DISCHARGE PLAN: Attend 12-step recovery group Outpatient therapy Return to previous living arrangement  PATIENT/FAMIILY INVOLVEMENT: This treatment plan has been presented to and reviewed with the patient, Coralee Northdam J Garro.  The patient and family have been given the opportunity to ask questions and make suggestions.  Larry SierrasMiddleton, Khamani Daniely P 01/18/2016, 8:35 PM

## 2016-01-18 NOTE — ED Notes (Signed)
Patient transferred to [facility].  Left the unit ambulatory with [facility].  All belongings given to the driver.  

## 2016-01-18 NOTE — ED Notes (Signed)
Pt states that he is suicidal with plan to " go out into the woods and overdose or slit my wrists where no one will ever find me".  Pt states that he "done something stupid last night and did crack cocaine".  Pt denies problems with drugs.  Denies etoh use.  References 41 year old son that would be devastated if he killed himself.  Pt states he has been off of his medications for a month and a half because he stopped going to [facility]--where he got his psych meds filled.

## 2016-01-19 DIAGNOSIS — F332 Major depressive disorder, recurrent severe without psychotic features: Principal | ICD-10-CM

## 2016-01-19 MED ORDER — VITAMIN B-1 100 MG PO TABS
100.0000 mg | ORAL_TABLET | Freq: Every day | ORAL | Status: DC
Start: 2016-01-20 — End: 2016-01-22
  Administered 2016-01-20 – 2016-01-22 (×3): 100 mg via ORAL
  Filled 2016-01-19 (×5): qty 1

## 2016-01-19 MED ORDER — HYDROXYZINE HCL 25 MG PO TABS: 25.0000 mg | ORAL_TABLET | Freq: Four times a day (QID) | ORAL | Status: DC | PRN

## 2016-01-19 MED ORDER — LORAZEPAM 1 MG PO TABS
1.0000 mg | ORAL_TABLET | Freq: Four times a day (QID) | ORAL | Status: DC | PRN
Start: 2016-01-19 — End: 2016-01-22
  Administered 2016-01-21: 1 mg via ORAL
  Filled 2016-01-19: qty 1

## 2016-01-19 MED ORDER — ONDANSETRON 4 MG PO TBDP
4.0000 mg | ORAL_TABLET | Freq: Four times a day (QID) | ORAL | Status: AC | PRN
  Administered 2016-01-19: 4 mg via ORAL
  Filled 2016-01-19: qty 1

## 2016-01-19 MED ORDER — HYDROXYZINE HCL 25 MG PO TABS
25.0000 mg | ORAL_TABLET | Freq: Four times a day (QID) | ORAL | Status: AC | PRN
  Administered 2016-01-19 – 2016-01-22 (×4): 25 mg via ORAL
  Filled 2016-01-19 (×4): qty 1

## 2016-01-19 MED ORDER — VITAMIN B-1 100 MG PO TABS
100.0000 mg | ORAL_TABLET | Freq: Every day | ORAL | Status: DC
  Filled 2016-01-19 (×2): qty 1

## 2016-01-19 MED ORDER — ADULT MULTIVITAMIN W/MINERALS CH: 1.0000 | ORAL_TABLET | Freq: Every day | ORAL | Status: DC

## 2016-01-19 MED ORDER — ADULT MULTIVITAMIN W/MINERALS CH
1.0000 | ORAL_TABLET | Freq: Every day | ORAL | Status: DC
  Administered 2016-01-19 – 2016-01-22 (×4): 1 via ORAL
  Filled 2016-01-19 (×6): qty 1

## 2016-01-19 MED ORDER — LORAZEPAM 1 MG PO TABS
1.0000 mg | ORAL_TABLET | Freq: Four times a day (QID) | ORAL | Status: DC | PRN
Start: 2016-01-19 — End: 2016-01-19

## 2016-01-19 MED ORDER — LOPERAMIDE HCL 2 MG PO CAPS: 2.0000 mg | ORAL_CAPSULE | ORAL | Status: DC | PRN

## 2016-01-19 MED ORDER — LORAZEPAM 1 MG PO TABS: 1.0000 mg | ORAL_TABLET | Freq: Every day | ORAL | Status: DC

## 2016-01-19 MED ORDER — LORAZEPAM 1 MG PO TABS
1.0000 mg | ORAL_TABLET | Freq: Four times a day (QID) | ORAL | Status: AC
  Administered 2016-01-19 – 2016-01-20 (×4): 1 mg via ORAL
  Filled 2016-01-19 (×4): qty 1

## 2016-01-19 MED ORDER — LORAZEPAM 1 MG PO TABS: 1.0000 mg | ORAL_TABLET | Freq: Two times a day (BID) | ORAL | Status: DC

## 2016-01-19 MED ORDER — THIAMINE HCL 100 MG/ML IJ SOLN
100.0000 mg | Freq: Once | INTRAMUSCULAR | Status: AC
  Administered 2016-01-19: 100 mg via INTRAMUSCULAR
  Filled 2016-01-19: qty 2

## 2016-01-19 MED ORDER — THIAMINE HCL 100 MG/ML IJ SOLN: 100.0000 mg | Freq: Once | INTRAMUSCULAR | Status: DC

## 2016-01-19 MED ORDER — ONDANSETRON 4 MG PO TBDP: 4.0000 mg | ORAL_TABLET | Freq: Four times a day (QID) | ORAL | Status: DC | PRN

## 2016-01-19 MED ORDER — LORAZEPAM 1 MG PO TABS
1.0000 mg | ORAL_TABLET | Freq: Three times a day (TID) | ORAL | Status: DC
  Filled 2016-01-19: qty 1

## 2016-01-19 MED ORDER — DULOXETINE HCL 30 MG PO CPEP
30.0000 mg | ORAL_CAPSULE | Freq: Every day | ORAL | Status: DC
  Administered 2016-01-19 – 2016-01-21 (×3): 30 mg via ORAL
  Filled 2016-01-19 (×5): qty 1

## 2016-01-19 NOTE — H&P (Addendum)
Psychiatric Admission Assessment Adult  Patient Identification: Tyler Lambert MRN:  [id number] Date of Evaluation:  01/19/2016 Chief Complaint:   " Everything has been piling up" Principal Diagnosis:  Major Depression, Recurrent, no Psychotic Features  Diagnosis:   Patient Active Problem List   Diagnosis Date Noted  . Depression, major, severe recurrence ([place]) [F33.2] 01/18/2016  . Major depressive disorder, recurrent, severe without psychotic features ([place]) [F33.2]   . Adjustment disorder with depressed mood [F43.21]   . Major depressive disorder, recurrent episode, severe ([place]) [F33.2] 11/01/2014  . MDD (major depressive disorder) ([place]) [F32.9] 03/26/2013  . ADHD (attention deficit hyperactivity disorder) [F90.9] 03/26/2013  . Anxiety [F41.9]   . Depression [F32.9]   . Acid reflux [K21.9]   . Irritable bowel syndrome [K58.9]    History of Present Illness:  40 year old male, who presented to hospital  due to worsening depression . He states " I have had a few really bad days", reports a subjective sense of feeling overwhelmed . He reports recent suicidal ideations, and on admission reported thoughts of shooting self . States he has had " on and off " suicidal ideations for some time.  He identifies psychosocial stressors which are contributing to his depression- separation, disability,financial difficulties, legal issues . He endorses neuro-vegetative symptoms of depression as below . Denies psychotic symptoms . Of note, he is not taking any psychiatric medications at this time, states he had been on Xanax for anxiety but had stopped it a few weeks ago . At this time is not presenting with any symptoms of WDL . Associated Signs/Symptoms: Depression Symptoms:  depressed mood, anhedonia, insomnia, feelings of worthlessness/guilt, difficulty concentrating, loss of energy/fatigue, suicidal ideations (Hypo) Manic Symptoms:  does not endorse, nor presents with any manic symptoms at this  time Anxiety Symptoms:  Reports feeling anxious and easily overwhelmed  Psychotic Symptoms:  Denies  PTSD Symptoms: Does not endorse at this time Total Time spent with patient: 45 minutes  Past Psychiatric History: Reports several prior psychiatric admissions for depression, reports suicidal attempt by self cutting in 2014, denies history of mania, denies history of psychosis, denies history of violence .   Is the patient at risk to self? Yes.    Has the patient been a risk to self in the past 6 months? Yes.    Has the patient been a risk to self within the distant past? Yes.    Is the patient a risk to others? No.  Has the patient been a risk to others in the past 6 months? No.  Has the patient been a risk to others within the distant past? No.   Prior Inpatient Therapy:  prior inpatient psychiatric admissions  Prior Outpatient Therapy:  not at present , had been following at [facility] in the past   Alcohol Screening: 1. How often do you have a drink containing alcohol?: Monthly or less 2. How many drinks containing alcohol do you have on a typical day when you are drinking?: 1 or 2 3. How often do you have six or more drinks on one occasion?: Less than monthly Preliminary Score: 1 Brief Intervention: AUDIT score less than 7 or less-screening does not suggest unhealthy drinking-brief intervention not indicated Substance Abuse History in the last 12 months:  Reports remote  history of alcohol abuse, but denies any recent alcohol abuse or misuse , denies drug abuse .  Reports recently taking Klonopin x 1- 2 times but states use was isolated . Of note,  UDS positive for cocaine and BZDs .  Consequences of Substance Abuse: Does not endorse  Previous Psychotropic Medications: has been on Xanax  and other BZDs  in the past - states he stopped Xanax a few weeks ago.  Not currently on any psychiatric medications. Has been on Prozac, Zyprexa, Neurontin in the past .  Psychological  Evaluations:  No  Past Medical History:  Past Medical History  Diagnosis Date  . Anxiety   . Depression   . Acid reflux   . Irritable bowel syndrome   . PTSD (post-traumatic stress disorder)   . Bipolar 1 disorder ([place])   . ADHD (attention deficit hyperactivity disorder)   . Insomnia     Past Surgical History  Procedure Laterality Date  . Upper gastrointestinal endoscopy     Family History: father passed away from MI, mother alive, sees her regularly, has two siblings  Family History  Problem Relation Age of Onset  . Heart failure Father    Family Psychiatric  History:  History of depression, parents, no history of suicides in family  Tobacco Screening: does not smoke cigarettes  Social History: separated, has a son who is currently with the grandmother, on disability, reports financial issues,history of legal issues History  Alcohol Use No    Comment: Reports had an issue in the past     History  Drug Use No    Comment: Denies drug use but positive cocaine; denied after confronted with result    Additional Social History:  Allergies:  No Known Allergies Lab Results:  Results for orders placed or performed during the hospital encounter of 01/18/16 (from the past 48 hour(s))  Comprehensive metabolic panel     Status: Abnormal   Collection Time: 01/18/16 11:03 AM  Result Value Ref Range   Sodium 142 135 - 145 mmol/L   Potassium 3.7 3.5 - 5.1 mmol/L   Chloride 110 101 - 111 mmol/L   CO2 26 22 - 32 mmol/L   Glucose, Bld 83 65 - 99 mg/dL   BUN 19 6 - 20 mg/dL   Creatinine, Ser 0.89 0.61 - 1.24 mg/dL   Calcium 8.7 (L) 8.9 - 10.3 mg/dL   Total Protein 7.1 6.5 - 8.1 g/dL   Albumin 4.1 3.5 - 5.0 g/dL   AST 19 15 - 41 U/L   ALT 17 17 - 63 U/L   Alkaline Phosphatase 43 38 - 126 U/L   Total Bilirubin 0.9 0.3 - 1.2 mg/dL   GFR calc non Af Amer >60 >60 mL/min   GFR calc Af Amer >60 >60 mL/min    Comment: (NOTE) The eGFR has been calculated using the CKD EPI equation. This  calculation has not been validated in all clinical situations. eGFR's persistently <60 mL/min signify possible Chronic Kidney Disease.    Anion gap 6 5 - 15  cbc     Status: Abnormal   Collection Time: 01/18/16 11:03 AM  Result Value Ref Range   WBC 11.5 (H) 4.0 - 10.5 K/uL   RBC 4.07 (L) 4.22 - 5.81 MIL/uL   Hemoglobin 12.0 (L) 13.0 - 17.0 g/dL   HCT 34.5 (L) 39.0 - 52.0 %   MCV 84.8 78.0 - 100.0 fL   MCH 29.5 26.0 - 34.0 pg   MCHC 34.8 30.0 - 36.0 g/dL   RDW 13.5 11.5 - 15.5 %   Platelets 213 150 - 400 K/uL  Ethanol     Status: None   Collection Time: 01/18/16 11:07 AM  Result Value Ref Range   Alcohol, Ethyl (B) <5 <5 mg/dL    Comment:        LOWEST DETECTABLE LIMIT FOR SERUM ALCOHOL IS 5 mg/dL FOR MEDICAL PURPOSES ONLY   Salicylate level     Status: None   Collection Time: 01/18/16 11:07 AM  Result Value Ref Range   Salicylate Lvl <1.7 2.8 - 30.0 mg/dL  Acetaminophen level     Status: Abnormal   Collection Time: 01/18/16 11:07 AM  Result Value Ref Range   Acetaminophen (Tylenol), Serum <10 (L) 10 - 30 ug/mL    Comment:        THERAPEUTIC CONCENTRATIONS VARY SIGNIFICANTLY. A RANGE OF 10-30 ug/mL MAY BE AN EFFECTIVE CONCENTRATION FOR MANY PATIENTS. HOWEVER, SOME ARE BEST TREATED AT CONCENTRATIONS OUTSIDE THIS RANGE. ACETAMINOPHEN CONCENTRATIONS >150 ug/mL AT 4 HOURS AFTER INGESTION AND >50 ug/mL AT 12 HOURS AFTER INGESTION ARE OFTEN ASSOCIATED WITH TOXIC REACTIONS.   Rapid urine drug screen (hospital performed)     Status: Abnormal   Collection Time: 01/18/16 12:09 PM  Result Value Ref Range   Opiates NONE DETECTED NONE DETECTED   Cocaine POSITIVE (A) NONE DETECTED   Benzodiazepines POSITIVE (A) NONE DETECTED   Amphetamines NONE DETECTED NONE DETECTED   Tetrahydrocannabinol NONE DETECTED NONE DETECTED   Barbiturates NONE DETECTED NONE DETECTED    Comment:        DRUG SCREEN FOR MEDICAL PURPOSES ONLY.  IF CONFIRMATION IS NEEDED FOR ANY PURPOSE, NOTIFY  LAB WITHIN 5 DAYS.        LOWEST DETECTABLE LIMITS FOR URINE DRUG SCREEN Drug Class       Cutoff (ng/mL) Amphetamine      1000 Barbiturate      200 Benzodiazepine   616 Tricyclics       073 Opiates          300 Cocaine          300 THC              50     Blood Alcohol level:  Lab Results  Component Value Date   ETH <5 01/18/2016   ETH <5 71/01/2693    Metabolic Disorder Labs:  Lab Results  Component Value Date   HGBA1C 5.1 11/02/2014   MPG 100 11/02/2014   No results found for: PROLACTIN Lab Results  Component Value Date   CHOL 182 11/02/2014   TRIG 353* 11/02/2014   HDL 35* 11/02/2014   CHOLHDL 5.2 11/02/2014   VLDL 71* 11/02/2014   LDLCALC 76 11/02/2014    Current Medications: Current Facility-Administered Medications  Medication Dose Route Frequency Provider Last Rate Last Dose  . acetaminophen (TYLENOL) tablet 650 mg  650 mg Oral Q6H PRN Patrecia Pour, NP      . alum & mag hydroxide-simeth (MAALOX/MYLANTA) 200-200-20 MG/5ML suspension 30 mL  30 mL Oral Q4H PRN Patrecia Pour, NP      . clonazePAM [facility] [place]) tablet 0.5 mg  0.5 mg Oral BID Patrecia Pour, NP   0.5 mg at 01/19/16 0756  . magnesium hydroxide (MILK OF MAGNESIA) suspension 30 mL  30 mL Oral Daily PRN Patrecia Pour, NP      . OLANZapine [facility]) tablet 2.5 mg  2.5 mg Oral QHS Patrecia Pour, NP   2.5 mg at 01/18/16 2100  . sodium chloride (OCEAN) 0.65 % nasal spray 1 spray  1 spray Each Nare PRN Jenne Campus, MD   1 spray at 01/18/16 2059  PTA Medications: Prescriptions prior to admission  Medication Sig Dispense Refill Last Dose  . clonazePAM (KLONOPIN) 0.5 MG tablet Take 1 tablet (0.5 mg total) by mouth 2 (two) times daily. (Patient not taking: Reported on 01/18/2016) 60 tablet 0 Not Taking at Unknown time  . diphenhydramine-acetaminophen (TYLENOL PM) 25-500 MG TABS Take 4 tablets by mouth at bedtime as needed (sleep).   10/30/2014 at Unknown time  . FLUoxetine (PROZAC) 20 MG capsule Take 1  capsule (20 mg total) by mouth daily. For depression (Patient not taking: Reported on 01/18/2016) 30 capsule 0 Not Taking at Unknown time  . gabapentin (NEURONTIN) 100 MG capsule Take 2 capsules (200 mg total) by mouth 2 (two) times daily. For aggressive behavior/agitation (Patient not taking: Reported on 01/18/2016) 120 capsule 0 Not Taking at Unknown time  . OLANZapine (ZYPREXA) 2.5 MG tablet Take 1 tablet (2.5 mg total) by mouth at bedtime. For mood control (Patient not taking: Reported on 01/18/2016) 30 tablet 0 Not Taking at Unknown time  . zolpidem (AMBIEN) 5 MG tablet Take 1 tablet (5 mg total) by mouth at bedtime and may repeat dose one time if needed. For sleep (Patient not taking: Reported on 01/18/2016) 30 tablet 0 Not Taking at Unknown time    Musculoskeletal: Strength & Muscle Tone: within normal limits Gait & Station: normal Patient leans: N/A  Psychiatric Specialty Exam: Physical Exam  Review of Systems  Constitutional: Negative.   HENT: Negative.   Eyes: Negative.   Respiratory: Negative.   Cardiovascular: Negative.   Gastrointestinal: Negative.   Genitourinary: Negative.   Musculoskeletal: Negative.   Skin: Negative.   Neurological: Negative.   Endo/Heme/Allergies: Negative.   Psychiatric/Behavioral: Positive for depression, suicidal ideas and substance abuse.  All other systems reviewed and are negative. reports history of knee pain  Blood pressure 112/75, pulse 85, temperature 98 F (36.7 C), temperature source Oral, resp. rate 17, height 5' 8.25" (1.734 m), weight 168 lb (76.204 kg).Body mass index is 25.34 kg/(m^2).  General Appearance: Fairly Groomed  Eye Contact:  Fair  Speech:  Normal Rate  Volume:  Decreased  Mood:  Anxious and Depressed  Affect:  Constricted  Thought Process:  Linear  Orientation:  Other:  fully alert and attentive   Thought Content:  denies hallucinations, no delusions , not internally preoccupied   Suicidal Thoughts:  No at this time denies  any active  suicidal ideations and contracts for safety on the unit   Homicidal Thoughts:  No  Memory:  recent and remote grossly intact   Judgement:  Fair  Insight:  Fair  Psychomotor Activity:  Decreased  Concentration:  Concentration: Good and Attention Span: Good  Recall:  Good  Fund of Knowledge:  Good  Language:  Good  Akathisia:  Negative  Handed:  Right  AIMS (if indicated):     Assets:  Desire for Improvement Resilience  ADL's:  Fair   Cognition:  WNL  Sleep:  Number of Hours: 6.75       Treatment Plan Summary: Daily contact with patient to assess and evaluate symptoms and progress in treatment, Medication management, Plan inpatient admission and medications as below   Observation Level/Precautions:  15 minute checks  Laboratory:  as needed   Psychotherapy:  Milieu, support   Medications:  We discussed options, and patient agrees to Cymbalta trial to address depression, anxiety, and may help chronic pain  Start Cymbalta 30 mgrs QDAY  D/C low dose Zyprexa . As patient reports recent use of BZD,  and admission UDS is positive of BZD , will start Ativan PRNs for potential withdrawal symptoms as needed   Consultations:  As needed  Discharge Concerns:  -   Estimated LOS: 5-6 days   Other:     I certify that inpatient services furnished can reasonably be expected to improve the patient's condition.    Neita Garnet, MD 6/6/201712:06 PM

## 2016-01-19 NOTE — [facility] Group Notes (Signed)
[facility] LCSW Group Therapy 01/19/2016 1:15 PM  Type of Therapy: Group Therapy- Feelings about Diagnosis  Participation Level: Active   Participation Quality:  Appropriate  Affect:  Appropriate  Cognitive: Alert and Oriented   Insight:  Developing   Engagement in Therapy: Developing/Improving and Engaged   Modes of Intervention: Clarification, Confrontation, Discussion, Education, Exploration, Limit-setting, Orientation, Problem-solving, Rapport Building, Dance movement psychotherapisteality Testing, Socialization and Support  Description of Group:   This group will allow patients to explore their thoughts and feelings about diagnoses they have received. Patients will be guided to explore their level of understanding and acceptance of these diagnoses. Facilitator will encourage patients to process their thoughts and feelings about the reactions of others to their diagnosis, and will guide patients in identifying ways to discuss their diagnosis with significant others in their lives. This group will be process-oriented, with patients participating in exploration of their own experiences as well as giving and receiving support and challenge from other group members.  Summary of Progress/Problems:  Pt expressed that he has a friend who at times will invalidate his mental illness by telling him that the medicine isn't needed and that Pt should pray more. Pt discussed that he feels supported by his girlfriend.  Therapeutic Modalities:   Cognitive Behavioral Therapy Solution Focused Therapy Motivational Interviewing Relapse Prevention Therapy  Chad CordialLauren Carter, LCSWA 01/19/2016 3:55 PM
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
## 2016-01-19 NOTE — Progress Notes (Signed)
D:Patient in the dayroom on approach.  Patient states he slept a lot today.  Patient states he has been tired today.  Patient denies SI/HI and denies AVH.  Patient also states he has remained congested and has been getting nasal spray.   A: Staff to monitor Q 15 mins for safety.  Encouragement and support offered.  Scheduled medications administered per orders.  Maalox and vistaril administered prn tonight. R: Patient remains safe on the unit.  Patient attended group tonight.  Patient visible on the unit and interacting with peers.  Patient taking administered medications.
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
## 2016-01-19 NOTE — Progress Notes (Signed)
D- Patient has been pacing the hall, patient states that he is agitated and frustrated.  Patient reported feeling overwhelmed because the doctor took him off of his anxiety medications.  Patient was noted coming out of the doctor's office while no one else was in it and was told that it was not permissible to enter an empty office.  Patient left a not in the doctor's office asking the physician if he was a "quack".  Patient denies SI, HI and AVH but reports increased anxiety and depression.   A- Assess patient for safety, offer medications as prescribed, engage patient in 1:1 staff talks  R- Continue to monitor as planned.

## 2016-01-19 NOTE — Tx Team (Signed)
Interdisciplinary Treatment Plan Update (Adult) Date: 01/19/2016   Date: 01/19/2016 1:19 PM  Progress in Treatment:  Attending groups: Pt is new to milieu, continuing to assess  Participating in groups: Pt is new to milieu, continuing to assess  Taking medication as prescribed: Yes  Tolerating medication: Yes  Family/Significant othe contact made: No, CSW assessing for appropriate contact Patient understands diagnosis: Yes AEB seeking help with depression Discussing patient identified problems/goals with staff: Yes  Medical problems stabilized or resolved: Yes  Denies suicidal/homicidal ideation: Pt recently admitted with SI Patient has not harmed self or Others: Yes   New problem(s) identified: None identified at this time.   Discharge Plan or Barriers: CSW will assess for appropriate discharge plan and relevant barriers.   Additional comments:  Patient and CSW reviewed pt's identified goals and treatment plan. Patient verbalized understanding and agreed to treatment plan. CSW reviewed [facility] "Discharge Process and Patient Involvement" Form. Pt verbalized understanding of information provided and signed form.   Reason for Continuation of Hospitalization:  Depression Medication stabilization Suicidal ideation  Estimated length of stay: 3-5 days  Review of initial/current patient goals per problem list:   1.  Goal(s): Patient will participate in aftercare plan  Met:  No  Target date: 3-5 days from date of admission   As evidenced by: Patient will participate within aftercare plan AEB aftercare provider and housing plan at discharge being identified.  01/19/16: CSW to work with Pt to assess for appropriate discharge plan and faciliate appointments and referrals as needed prior to d/c.  2.  Goal (s): Patient will exhibit decreased depressive symptoms and suicidal ideations.  Met:  No  Target date: 3-5 days from date of admission   As evidenced by: Patient will utilize self rating  of depression at 3 or below and demonstrate decreased signs of depression or be deemed stable for discharge by MD. 01/19/16: Pt was admitted with symptoms of depression, rating 10/10. Pt continues to present with flat affect and depressive symptoms.  Pt will demonstrate decreased symptoms of depression and rate depression at 3/10 or lower prior to discharge.  Attendees:  Patient:    Family:    Physician: Dr. Parke Poisson, MD  01/19/2016 1:19 PM  Nursing: Lars Pinks, RN Case manager  01/19/2016 1:19 PM  Clinical Social Worker Peri Maris, [place] 01/19/2016 1:19 PM  Other: Tilden Fossa, LCSWA 01/19/2016 1:19 PM  Clinical: Grayland Ormond RN; Marilynne Halsted, RN 01/19/2016 1:19 PM  Other: , RN Charge Nurse 01/19/2016 1:19 PM  Other: Hilda Lias, [place], [place] Social Work [phone]

## 2016-01-19 NOTE — Progress Notes (Signed)
Recreation Therapy Notes  Animal-Assisted Activity (AAA) Program Checklist/Progress Notes Patient Eligibility Criteria Checklist & Daily Group note for Rec Tx Intervention  Date: 06.06.2017 Time: 2:45pm Location: 400 Hall Dayroom    AAA/T Program Assumption of Risk Form signed by Patient/ or Parent Legal Guardian Yes  Patient is free of allergies or sever asthma Yes  Patient reports no fear of animals Yes  Patient reports no history of cruelty to animals Yes  Patient understands his/her participation is voluntary Yes  Patient washes hands before animal contact Yes  Patient washes hands after animal contact Yes  Behavioral Response: Engaged, Attentive.   Education: Hand Washing, Appropriate Animal Interaction   Education Outcome: Acknowledges education.  Clinical Observations/Feedback: Patient interacted appropriately with therapy dog, petting him appropriately and asking appropriate questions about therapy dog and his training. Additionally patient interacted appropriately with peers during session.   Kora Groom L Kagan Mutchler, LRT/CTRS        Glenette Bookwalter L 01/19/2016 3:03 PM 

## 2016-01-20 DIAGNOSIS — F332 Major depressive disorder, recurrent severe without psychotic features: Secondary | ICD-10-CM | POA: Insufficient documentation

## 2016-01-20 LAB — TSH: TSH: 3.459 u[IU]/mL (ref 0.350–4.500)

## 2016-01-20 MED ORDER — LORAZEPAM 1 MG PO TABS
1.0000 mg | ORAL_TABLET | Freq: Three times a day (TID) | ORAL | Status: DC
  Administered 2016-01-20 – 2016-01-21 (×2): 1 mg via ORAL
  Filled 2016-01-20: qty 1

## 2016-01-20 NOTE — Progress Notes (Signed)
Patient ID: Tyler Lambert, male   DOB: 1976-04-17, 40 y.o.   MRN: [id number]   Pt currently presents with a flat affect and sullen, guarded behavior. Per self inventory, pt rates depression at a 6, hopelessness 6 and anxiety 7. Pt's daily goal is to "stay social" and they intend to do so by "try to stay out of my shell." Pt reports good sleep, a good appetite, low energy and good concentration. Pt minimizes all withdrawal symptoms. Pt interacts minimally with peers, sits inside door frame and watches others.   Pt provided with medications per providers orders. Pt's labs and vitals were monitored throughout the day. Pt supported emotionally and encouraged to express concerns and questions. Pt educated on medications.  Pt's safety ensured with 15 minute and environmental checks. Pt currently denies SI/HI and A/V hallucinations. Pt verbally agrees to seek staff if SI/HI or A/VH occurs and to consult with staff before acting on any harmful thoughts. Will continue POC.

## 2016-01-20 NOTE — Progress Notes (Signed)
Recreation Therapy Notes  Date: 06.07.2017 Time: 9:30am Location: 300 Hall Group Room   Group Topic: Stress Management  Goal Area(s) Addresses:  Patient will actively participate in stress management techniques presented during session.   Behavioral Response:Did not attend.   Lazara Grieser L Donnivan Villena, LRT/CTRS         Sadie Hazelett L 01/20/2016 11:54 AM 

## 2016-01-20 NOTE — [facility] Group Notes (Signed)
[facility]  01/20/2016 8:45 AM  Pt did not attend, declined invitation.  Chad CordialLauren Carter, LCSWA 01/20/2016 9:49 AM

## 2016-01-20 NOTE — B[facility] Group Notes (Signed)
B[facility] LCSW Group Therapy Note  01/20/2016 2:45pm  Type of Therapy and Topic: Group Therapy: Holding onto Grudges   Pt did not attend, declined invitation.    Chad CordialLauren Carter, LCSWA 01/20/2016 2:43 PM

## 2016-01-20 NOTE — Progress Notes (Signed)
[facility] MD Progress Note  01/20/2016 11:50 AM Tyler Lambert  MRN:  [id number] Subjective:  Patient reports he is feeling " better". At this time does not endorse medication side effects. Objective : I have discussed case with treatment team and have met with patient. Patient presents vaguely irritable, dysphoric, but reports mood as improved . Denies any suicidal ideations at this time, contracts for safety on the unit.  Reports he slept better last night than he had been prior to admission Some group attendance, no disruptive behaviors on unit . At this time less restless than yesterday, and vitals stable. No significant distal tremors. Facial flushing improved compared to yesterday. Labs - TSH WNL.  Principal Problem: Depression, major, severe recurrence ([place]) Diagnosis:   Patient Active Problem List   Diagnosis Date Noted  . Depression, major, severe recurrence ([place]) [F33.2] 01/18/2016  . Major depressive disorder, recurrent, severe without psychotic features ([place]) [F33.2]   . Adjustment disorder with depressed mood [F43.21]   . Major depressive disorder, recurrent episode, severe ([place]) [F33.2] 11/01/2014  . MDD (major depressive disorder) ([place]) [F32.9] 03/26/2013  . ADHD (attention deficit hyperactivity disorder) [F90.9] 03/26/2013  . Anxiety [F41.9]   . Depression [F32.9]   . Acid reflux [K21.9]   . Irritable bowel syndrome [K58.9]    Total Time spent with patient: 20 minutes    Past Medical History:  Past Medical History  Diagnosis Date  . Anxiety   . Depression   . Acid reflux   . Irritable bowel syndrome   . PTSD (post-traumatic stress disorder)   . Bipolar 1 disorder ([place])   . ADHD (attention deficit hyperactivity disorder)   . Insomnia     Past Surgical History  Procedure Laterality Date  . Upper gastrointestinal endoscopy     Family History:  Family History  Problem Relation Age of Onset  . Heart failure Father     Social History:  History  Alcohol Use No     Comment: Reports had an issue in the past     History  Drug Use No    Comment: Denies drug use but positive cocaine; denied after confronted with result    Social History   Social History  . Marital Status: Legally Separated    Spouse Name: N/A  . Number of Children: N/A  . Years of Education: N/A   Social History Main Topics  . Smoking status: Never Smoker   . Smokeless tobacco: Current User    Types: Snuff  . Alcohol Use: No     Comment: Reports had an issue in the past  . Drug Use: No     Comment: Denies drug use but positive cocaine; denied after confronted with result  . Sexual Activity: Not Asked   Other Topics Concern  . None   Social History Narrative   Additional Social History:   Sleep: Fair  Appetite:  improving   Current Medications: Current Facility-Administered Medications  Medication Dose Route Frequency Provider Last Rate Last Dose  . acetaminophen (TYLENOL) tablet 650 mg  650 mg Oral Q6H PRN Patrecia Pour, NP      . alum & mag hydroxide-simeth (MAALOX/MYLANTA) 200-200-20 MG/5ML suspension 30 mL  30 mL Oral Q4H PRN Patrecia Pour, NP   30 mL at 01/19/16 2059  . DULoxetine (CYMBALTA) DR capsule 30 mg  30 mg Oral Daily Jenne Campus, MD   30 mg at 01/20/16 0846  . hydrOXYzine (ATARAX/VISTARIL) tablet 25 mg  25 mg Oral  Q6H PRN Jenne Campus, MD   25 mg at 01/19/16 2109  . loperamide (IMODIUM) capsule 2-4 mg  2-4 mg Oral PRN Jenne Campus, MD      . LORazepam (ATIVAN) tablet 1 mg  1 mg Oral Q6H PRN Myer Peer Sun Kihn, MD      . LORazepam (ATIVAN) tablet 1 mg  1 mg Oral TID Jenne Campus, MD       Followed by  . [START ON 01/21/2016] LORazepam (ATIVAN) tablet 1 mg  1 mg Oral BID Jenne Campus, MD       Followed by  . [START ON 01/23/2016] LORazepam (ATIVAN) tablet 1 mg  1 mg Oral Daily [place] A Bostyn Kunkler, MD      . magnesium hydroxide (MILK OF MAGNESIA) suspension 30 mL  30 mL Oral Daily PRN Patrecia Pour, NP      . multivitamin with minerals  tablet 1 tablet  1 tablet Oral Daily Jenne Campus, MD   1 tablet at 01/20/16 0846  . ondansetron (ZOFRAN-ODT) disintegrating tablet 4 mg  4 mg Oral Q6H PRN Jenne Campus, MD   4 mg at 01/19/16 2109  . sodium chloride (OCEAN) 0.65 % nasal spray 1 spray  1 spray Each Nare PRN Jenne Campus, MD   1 spray at 01/20/16 0846  . thiamine (VITAMIN B-1) tablet 100 mg  100 mg Oral Daily Jenne Campus, MD   100 mg at 01/20/16 7829    Lab Results:  Results for orders placed or performed during the hospital encounter of 01/18/16 (from the past 48 hour(s))  TSH     Status: None   Collection Time: 01/20/16  6:23 AM  Result Value Ref Range   TSH 3.459 0.350 - 4.500 uIU/mL    Comment: Performed at [facility]    Blood Alcohol level:  Lab Results  Component Value Date   [facility] <5 01/18/2016   ETH <5 10/31/2014    Physical Findings: AIMS: Facial and Oral Movements Muscles of Facial Expression: None, normal Lips and Perioral Area: None, normal Jaw: None, normal Tongue: None, normal,Extremity Movements Upper (arms, wrists, hands, fingers): None, normal Lower (legs, knees, ankles, toes): None, normal, Trunk Movements Neck, shoulders, hips: None, normal, Overall Severity Severity of abnormal movements (highest score from questions above): None, normal Incapacitation due to abnormal movements: None, normal Patient's awareness of abnormal movements (rate only patient's report): No Awareness, Dental Status Current problems with teeth and/or dentures?: No Does patient usually wear dentures?: No  CIWA:  CIWA-Ar Total: 6 COWS:     Musculoskeletal: Strength & Muscle Tone: within normal limits Gait & Station: normal Patient leans: N/A  Psychiatric Specialty Exam: Physical Exam  ROS no chest pain,  No shortness of breath, no vomiting   Blood pressure 108/70, pulse 79, temperature 98.4 F (36.9 C), temperature source Oral, resp. rate 16, height 5' 8.25" (1.734 m), weight  168 lb (76.204 kg).Body mass index is 25.34 kg/(m^2).  General Appearance: Fairly Groomed  Eye Contact:  Good  Speech:  decreased   Volume:  Decreased  Mood:  reports feeling better, presents dysphoric, irritable   Affect:  some irritability   Thought Process:  Linear  Orientation:  Other:  fully alert and attentive   Thought Content:  denies hallucinations, no delusions   Suicidal Thoughts:  No- denies suicidal ideations , denies self injurious ideations   Homicidal Thoughts:  No- denies homicidal ideations   Memory:  recent and  remote grossly intact   Judgement:  Fair  Insight:  Fair  Psychomotor Activity:  Decreased   Concentration:  Concentration: Good and Attention Span: Good  Recall:  Good  Fund of Knowledge:  Good  Language:  Good  Akathisia:  Negative  Handed:  Right  AIMS (if indicated):     Assets:  Desire for Improvement Resilience  ADL's:  Intact  Cognition:  WNL  Sleep:  Number of Hours: 5   Assessment - patient reports feeling better, reports improving mood, denies suicidal ideations. Presents vaguely irritable and angry, ruminative about BZD taper. We have reviewed rationale for BZD taper to include recent use of non- prescribed  BZDs, Cocaine, history of ETOH Abuse in the past,  withdrawal symptoms and risk. Tolerating medications well at this time  Treatment Plan Summary: Daily contact with patient to assess and evaluate symptoms and progress in treatment, Medication management, Plan inpatient admission  and medications as below  Continue to encourage group, milieu participation to work on coping skills and symptom reduction  Continue Ativan taper, as per protocol, to minimize risk of WDL Continue Cymbalta 30 mgrs QDAY for depression and anxiety Glennis Montenegro, MD 01/20/2016, 11:50 AM

## 2016-01-21 MED ORDER — NICOTINE 14 MG/24HR TD PT24
14.0000 mg | MEDICATED_PATCH | Freq: Every day | TRANSDERMAL | Status: DC
  Administered 2016-01-21 – 2016-01-22 (×2): 14 mg via TRANSDERMAL
  Filled 2016-01-21 (×5): qty 1

## 2016-01-21 MED ORDER — DULOXETINE HCL 20 MG PO CPEP
40.0000 mg | ORAL_CAPSULE | Freq: Every day | ORAL | Status: DC
  Administered 2016-01-22: 40 mg via ORAL
  Filled 2016-01-21 (×2): qty 2

## 2016-01-21 MED ORDER — LORAZEPAM 1 MG PO TABS
1.0000 mg | ORAL_TABLET | Freq: Two times a day (BID) | ORAL | Status: DC
  Administered 2016-01-21 – 2016-01-22 (×2): 1 mg via ORAL
  Filled 2016-01-21 (×2): qty 1

## 2016-01-21 NOTE — Progress Notes (Signed)
Adult Psychoeducational Group Note  Date:  01/21/2016 Time:  09:00am  Group Topic/Focus:  Crisis Planning:   The purpose of this group is to help patients create a crisis plan for use upon discharge or in the future, as needed.  Participation Level:  Did Not Attend  Participation Quality:  n/a  Affect:  n/a  Cognitive: n/a  Insight: n/a  Engagement in Group: n/a  Modes of Intervention:  Activity, Discussion, Education and Support  Additional Comments:  Pt did not attend group, pt in bed asleep.   Aurora Maskwyman, Reda Citron E 01/21/2016, 9:57 AM

## 2016-01-21 NOTE — Progress Notes (Signed)
Patient ID: Tyler Lambert, male   DOB: [birth year], 40 y.o.   MRN: [id number] [facility] MD Progress Note  01/21/2016 6:52 PM Tyler Lambert  MRN:  [id number] Subjective: Patient states he is feeling "OK", states his mood is better, and at this time his major focus is to discharge soon. States his son's birthday is tomorrow and he does not want to miss it. At this time denies medication side effects. Objective : I have discussed case with treatment team and have met with patient. Patient reports improved mood and currently minimizes depression,also states he feels less anxious and less irritable. He does, however, present vaguely irritable, dysphoric, but states " it's because I am in the hospital and I would rather be home". Some milieu participation but limited interactions with peers and tends to isolate. No disruptive or agitated behaviors.  He denies any hallucinations and does not appear internally preoccupied or paranoid.Marland Kitchen He denies any  suicidal ideations or self injurious ideations   Labs- TSH WNL  Principal Problem: Depression, major, severe recurrence ([place]) Diagnosis:   Patient Active Problem List   Diagnosis Date Noted  . Severe episode of recurrent major depressive disorder, without psychotic features ([place]) [F33.2]   . Depression, major, severe recurrence ([place]) [F33.2] 01/18/2016  . Major depressive disorder, recurrent, severe without psychotic features ([place]) [F33.2]   . Adjustment disorder with depressed mood [F43.21]   . Major depressive disorder, recurrent episode, severe ([place]) [F33.2] 11/01/2014  . MDD (major depressive disorder) ([place]) [F32.9] 03/26/2013  . ADHD (attention deficit hyperactivity disorder) [F90.9] 03/26/2013  . Anxiety [F41.9]   . Depression [F32.9]   . Acid reflux [K21.9]   . Irritable bowel syndrome [K58.9]    Total Time spent with patient: 20 minutes    Past Medical History:  Past Medical History  Diagnosis Date  . Anxiety   . Depression   . Acid reflux    . Irritable bowel syndrome   . PTSD (post-traumatic stress disorder)   . Bipolar 1 disorder ([place])   . ADHD (attention deficit hyperactivity disorder)   . Insomnia     Past Surgical History  Procedure Laterality Date  . Upper gastrointestinal endoscopy     Family History:  Family History  Problem Relation Age of Onset  . Heart failure Father     Social History:  History  Alcohol Use No    Comment: Reports had an issue in the past     History  Drug Use No    Comment: Denies drug use but positive cocaine; denied after confronted with result    Social History   Social History  . Marital Status: Legally Separated    Spouse Name: N/A  . Number of Children: N/A  . Years of Education: N/A   Social History Main Topics  . Smoking status: Never Smoker   . Smokeless tobacco: Current User    Types: Snuff  . Alcohol Use: No     Comment: Reports had an issue in the past  . Drug Use: No     Comment: Denies drug use but positive cocaine; denied after confronted with result  . Sexual Activity: Not Asked   Other Topics Concern  . None   Social History Narrative   Additional Social History:   Sleep:  improving  Appetite:  improving   Current Medications: Current Facility-Administered Medications  Medication Dose Route Frequency Provider Last Rate Last Dose  . acetaminophen (TYLENOL) tablet 650 mg  650 mg Oral Q6H PRN Theodoro Clock  Leander Rams, NP   650 mg at 01/21/16 2458  . alum & mag hydroxide-simeth (MAALOX/MYLANTA) 200-200-20 MG/5ML suspension 30 mL  30 mL Oral Q4H PRN Patrecia Pour, NP   30 mL at 01/19/16 2059  . [START ON 01/22/2016] DULoxetine (CYMBALTA) DR capsule 40 mg  40 mg Oral Daily Myer Peer Cobos, MD      . hydrOXYzine (ATARAX/VISTARIL) tablet 25 mg  25 mg Oral Q6H PRN Jenne Campus, MD   25 mg at 01/20/16 2132  . loperamide (IMODIUM) capsule 2-4 mg  2-4 mg Oral PRN Jenne Campus, MD      . LORazepam (ATIVAN) tablet 1 mg  1 mg Oral Q6H PRN Jenne Campus, MD    1 mg at 01/21/16 1215  . LORazepam (ATIVAN) tablet 1 mg  1 mg Oral BID Jenne Campus, MD   1 mg at 01/21/16 1723  . magnesium hydroxide (MILK OF MAGNESIA) suspension 30 mL  30 mL Oral Daily PRN Patrecia Pour, NP      . multivitamin with minerals tablet 1 tablet  1 tablet Oral Daily Jenne Campus, MD   1 tablet at 01/21/16 0810  . nicotine (NICODERM CQ - dosed in mg/24 hours) patch 14 mg  14 mg Transdermal Daily Myer Peer Cobos, MD      . ondansetron (ZOFRAN-ODT) disintegrating tablet 4 mg  4 mg Oral Q6H PRN Jenne Campus, MD   4 mg at 01/19/16 2109  . sodium chloride (OCEAN) 0.65 % nasal spray 1 spray  1 spray Each Nare PRN Jenne Campus, MD   1 spray at 01/21/16 1215  . thiamine (VITAMIN B-1) tablet 100 mg  100 mg Oral Daily Jenne Campus, MD   100 mg at 01/21/16 0998    Lab Results:  Results for orders placed or performed during the hospital encounter of 01/18/16 (from the past 48 hour(s))  TSH     Status: None   Collection Time: 01/20/16  6:23 AM  Result Value Ref Range   TSH 3.459 0.350 - 4.500 uIU/mL    Comment: Performed at [facility]    Blood Alcohol level:  Lab Results  Component Value Date   [facility] <5 01/18/2016   ETH <5 10/31/2014    Physical Findings: AIMS: Facial and Oral Movements Muscles of Facial Expression: None, normal Lips and Perioral Area: None, normal Jaw: None, normal Tongue: None, normal,Extremity Movements Upper (arms, wrists, hands, fingers): None, normal Lower (legs, knees, ankles, toes): None, normal, Trunk Movements Neck, shoulders, hips: None, normal, Overall Severity Severity of abnormal movements (highest score from questions above): None, normal Incapacitation due to abnormal movements: None, normal Patient's awareness of abnormal movements (rate only patient's report): No Awareness, Dental Status Current problems with teeth and/or dentures?: No Does patient usually wear dentures?: No  CIWA:  CIWA-Ar Total:  0 COWS:     Musculoskeletal: Strength & Muscle Tone: within normal limits Gait & Station: normal Patient leans: N/A  Psychiatric Specialty Exam: Physical Exam  ROS no chest pain,  No shortness of breath, no vomiting   Blood pressure 112/72, pulse 93, temperature 98.5 F (36.9 C), temperature source Oral, resp. rate 18, height 5' 8.25" (1.734 m), weight 168 lb (76.204 kg).Body mass index is 25.34 kg/(m^2).  General Appearance: improved grooming   Eye Contact:  Good  Speech:  Normal Rate  Volume:  Decreased  Mood:  reports mood is better, does present somewhat dysphoric  Affect:  vaguely  irritable   Thought Process:  Linear  Orientation:  Other:  fully alert and attentive   Thought Content:  denies hallucinations, no delusions   Suicidal Thoughts:  No- denies suicidal ideations , denies self injurious ideations   Homicidal Thoughts:  No- denies homicidal ideations   Memory:  recent and remote grossly intact   Judgement:  Fair  Insight:  Fair  Psychomotor Activity:  Decreased   Concentration:  Concentration: Good and Attention Span: Good  Recall:  Good  Fund of Knowledge:  Good  Language:  Good  Akathisia:  Negative  Handed:  Right  AIMS (if indicated):     Assets:  Desire for Improvement Resilience  ADL's:  Intact  Cognition:  WNL  Sleep:  Number of Hours: 5   Assessment - patient reports he is doing better, reports improved mood and currently minimizes depression.  He does present vaguely dysphoric and irritable. Denies any SI and behavior on unit is in good control. At this time does not appear to be in any acute distress and is tolerating medications well .  Patient reports he has been on BZDs for anxiety for " years ", and at this time does not report motivation in detoxing, states " they help me with my anxiety".  Treatment Plan Summary: Daily contact with patient to assess and evaluate symptoms and progress in treatment, Medication management, Plan inpatient admission   and medications as below  Continue to encourage group, milieu participation to work on coping skills and symptom reduction  Decrease Ativan to 1 mgr BID for anxiety  Increase  Cymbalta to 40 mgrs QDAY for depression and anxiety Treatment team working on disposition planning  Neita Garnet, MD 01/21/2016, 6:52 PM

## 2016-01-21 NOTE — [facility] Group Notes (Signed)
[facility] LCSW Group Therapy  01/21/2016 3:35 PM  Type of Therapy:  Group Therapy  Participation Level:  Invited, chose not to attend.  Summary of Progress/Problems:  [facility] Speaker came to talk about his personal journey with substance abuse and addiction. The pt processed ways by which to relate to the speaker. [facility] speaker provided handouts and educational information pertaining to groups and services offered by the [facility].   Sallee LangeCunningham, Bonnita Newby C 01/21/2016, 3:35 PM

## 2016-01-21 NOTE — Progress Notes (Signed)
Patient ID: Tyler Lambert, male   DOB: [birth year], [age] y.o.   MRN: [id number] D: Client visible on the unit, reports of his day "not bad" Client reports anxiety "5" of 10. Client follows male peer around, attempting to push her in wheel chair. A: Medication reviewed, administered as ordered. Client encouraged to focus on his mental well-being instead of peer. Client urged not to push peer wheelchair top prevent injury to self or others. Medication reviewed, administered as ordered. R: Client is safe on the unit, attends group. Client able to be redirected.

## 2016-01-21 NOTE — Progress Notes (Signed)
Patient did attend the evening karaoke group. Pt was attentive and supportive but did not participate by singing a song.   

## 2016-01-21 NOTE — Progress Notes (Signed)
Adult Psychoeducational Group Note  Date:  01/21/2016 Time:  3:21 AM  Group Topic/Focus:  Wrap-Up Group:   The focus of this group is to help patients review their daily goal of treatment and discuss progress on daily workbooks.  Participation Level:  Active  Participation Quality:  Appropriate  Affect:  Appropriate  Cognitive:  Alert  Insight: Appropriate  Engagement in Group:  Engaged  Modes of Intervention:  Discussion  Additional Comments:  Patient states, "I has a good day". Patient goal for today was to be more outspoken. Patient states he some what met goal.  Aslynn Brunetti L Jaiyana Canale 01/21/2016, 3:21 AM

## 2016-01-21 NOTE — Progress Notes (Signed)
Patient ID: Tyler Lambert, male   DOB: 21-Nov-1975, 40 y.o.   MRN: [id number]   Pt currently presents with a flat affect and guarded behavior. Per self inventory, pt rates depression at a 6, hopelessness 6 and anxiety 7. Pt's daily goal is to "keeping my cool, not getting upset" and they intend to do so by "think before I act or talk." Pt reports good sleep, a good appetite, low energy and poor concentration. Pt reports that he slept well last night. Reports anterior upper right throbbing pain in head. Pt writes "to those trying to help, thank you."  Pt provided with medications per providers orders. Pt's labs and vitals were monitored throughout the day. Pt supported emotionally and encouraged to express concerns and questions. Pt educated on medications.  Pt's safety ensured with 15 minute and environmental checks. Pt currently denies SI/HI and A/V hallucinations. Pt verbally agrees to seek staff if SI/HI or A/VH occurs and to consult with staff before acting on any harmful thoughts. Pt forwards little to staff. Will continue POC.

## 2016-01-21 NOTE — Progress Notes (Signed)
Patient ID: Tyler Lambert, male   DOB: 1976/01/23, 40 y.o.   MRN: [id number] D: Client has visitor this shift. Reports goal "to be more polite, more social" "I wanted to go home tomorrow to my son's pizza party, I told him I would come" A: Writer provided emotional support, Medications reviewed, administered as ordered. Staff will monitor q17mn for safety. R: Client is safe on the unit, attended group. Client reports goal met. "been out talking to people" "been to groups"

## 2016-01-21 NOTE — [facility] Suicide Risk Assessment (Signed)
[facility] INPATIENT:  Family/Significant Other Suicide Prevention Education  Suicide Prevention Education:  Patient Refusal for Family/Significant Other Suicide Prevention Education: The patient Tyler Lambert has refused to provide written consent for family/significant other to be provided Family/Significant Other Suicide Prevention Education during admission and/or prior to discharge.  Physician notified.  Sallee LangeCunningham, Anne C 01/21/2016, 6:14 PM

## 2016-01-22 MED ORDER — DULOXETINE HCL 40 MG PO CPEP: 40.0000 mg | ORAL_CAPSULE | Freq: Every day | ORAL | Status: DC

## 2016-01-22 MED ORDER — ADULT MULTIVITAMIN W/MINERALS CH: 1.0000 | ORAL_TABLET | Freq: Every day | ORAL | Status: DC

## 2016-01-22 NOTE — Progress Notes (Signed)
Patient ID: Tyler Lambert, male   DOB: 1976/08/07, 40 y.o.   MRN: [id number]  Pt discharged to lobby. Pt was stable and appreciative at that time. All papers and prescriptions were given and valuables returned. Verbal understanding expressed. Denies SI/HI and A/VH. Pt given opportunity to express concerns and ask questions.

## 2016-01-22 NOTE — Progress Notes (Signed)
  [facility]  Will you be returning to the same living situation after discharge:  Yes,  Pt returning home At discharge, do you have transportation home?: Yes,  Pt girlfriend to pick up Do you have the ability to pay for your medications: Yes,  Pt provided with prescriptions  Release of information consent forms completed and in the chart;  Patient's signature needed at discharge.  Patient to Follow up at: Follow-up Information    Follow up with [facility] On 01/29/2016.   Specialty:  Family Medicine   Why:  at 2:00pm with Denzil Hughesatherine W. for medication management.   Contact information:   [address] [place] [address] [phone]       Follow up with [facility] On 01/25/2016.   Why:  at 5:00pm for therapy with Judeth CornfieldStephanie.   Contact information:   [address]  [address] [place], [place] [address]                  Phone: [phone] Fax:  [phone]      Next level of care provider [facility] Link:no  Safety Planning and Suicide Prevention discussed: Yes,  with Pt; declined family contact  Have you used any form of tobacco in the last 30 days? (Cigarettes, Smokeless Tobacco, Cigars, and/or Pipes): Yes  Has patient been referred to the Quitline?: Patient refused referral  Patient has been referred for addiction treatment: Yes  Elaina HoopsCarter, Marget Outten M 01/22/2016, 12:09 PM

## 2016-01-22 NOTE — Tx Team (Signed)
Interdisciplinary Treatment Plan Update (Adult) Date: 01/22/2016   Date: 01/22/2016 10:33 AM  Progress in Treatment:  Attending groups: No  Participating in groups: No  Taking medication as prescribed: Yes  Tolerating medication: Yes  Family/Significant othe contact made: No, Pt declines Patient understands diagnosis: Yes AEB seeking help with depression Discussing patient identified problems/goals with staff: Yes  Medical problems stabilized or resolved: Yes  Denies suicidal/homicidal ideation: Yes Patient has not harmed self or Others: Yes   New problem(s) identified: None identified at this time.   Discharge Plan or Barriers: CSW will assess for appropriate discharge plan and relevant barriers.   Additional comments:  Patient and CSW reviewed pt's identified goals and treatment plan. Patient verbalized understanding and agreed to treatment plan. CSW reviewed [facility] "Discharge Process and Patient Involvement" Form. Pt verbalized understanding of information provided and signed form.   Reason for Continuation of Hospitalization:  Depression Medication stabilization Suicidal ideation  Estimated length of stay: 0 days  Review of initial/current patient goals per problem list:   1.  Goal(s): Patient will participate in aftercare plan  Met:  Yes  Target date: 3-5 days from date of admission   As evidenced by: Patient will participate within aftercare plan AEB aftercare provider and housing plan at discharge being identified.  01/19/16: CSW to work with Pt to assess for appropriate discharge plan and faciliate appointments and referrals as needed prior to d/c. 01/22/2016: Pt will return home and follow-up with outpatient providers  2.  Goal (s): Patient will exhibit decreased depressive symptoms and suicidal ideations.  Met:  Adequate for DC  Target date: 3-5 days from date of admission   As evidenced by: Patient will utilize self rating of depression at 3 or below and demonstrate  decreased signs of depression or be deemed stable for discharge by MD. 01/19/16: Pt was admitted with symptoms of depression, rating 10/10. Pt continues to present with flat affect and depressive symptoms.  Pt will demonstrate decreased symptoms of depression and rate depression at 3/10 or lower prior to discharge. 01/22/2016: MD feels that Pt's symptoms have decreased to the point that they can be managed in an outpatient setting.   Attendees:  Patient:    Family:    Physician: Dr. Cobos, MD  01/22/2016 10:33 AM  Nursing: Jennifer Clark, RN Case manager  01/22/2016 10:33 AM  Clinical Social Worker  Carter, LCSWA 01/22/2016 10:33 AM  Other: Kristin Drinkard, LCSWA 01/22/2016 10:33 AM  Clinical: Sara Twyman, RN 01/22/2016 10:33 AM  Other: , RN Charge Nurse 01/22/2016 10:33 AM  Other:      Carter, LCSWA Clinical Social Work [phone]      

## 2016-01-22 NOTE — Progress Notes (Signed)
Patient ID: Tyler Lambert, male   DOB: 1975/11/22, 40 y.o.   MRN: [id number]  Pt currently presents with a flat affect and ambivalent behavior. Pt reports depression at a 5. States goal is "getting home" and reports he will do so by "no matter what." Pt requests early discharge to attend his sons day before last day of school party.   Pt provided with medications per providers orders. Pt's labs and vitals were monitored throughout the day. Pt supported emotionally and encouraged to express concerns and questions. Pt educated on medications and suicide prevention resources.   Pt's safety ensured with 15 minute and environmental checks. Pt currently denies SI/HI and A/V hallucinations. Pt verbally agrees to seek staff if SI/HI or A/VH occurs and to consult with staff before acting on any harmful thoughts. Pt to be discharged per MD. Will continue POC.

## 2016-01-22 NOTE — Discharge Summary (Signed)
Physician Discharge Summary Note  Patient:  Tyler Lambert is an 40 y.o., male MRN:  [id number] DOB:  02-Sep-1975 Patient phone:  [phone] (home)  Patient address:   [address] [place] [address],  Total Time spent with patient: 30 minutes  Date of Admission:  01/18/2016 Date of Discharge: 01/22/2016  Reason for Admission:PER HPI-40 year old male, who presented to hospital due to worsening depression . He states " I have had a few really bad days", reports a subjective sense of feeling overwhelmed . He reports recent suicidal ideations, and on admission reported thoughts of shooting self . States he has had " on and off " suicidal ideations for some time. He identifies psychosocial stressors which are contributing to his depression-separation,disability,financial difficulties, legal issues .He endorses neuro-vegetative symptoms of depression as below . Denies psychotic symptoms .Of note, he is not taking any psychiatric medications at this time, states he had been on Xanax for anxiety but had stopped it a few weeks ago . At this time is not presenting with any symptoms of WDL .    Principal Problem: Depression, major, severe recurrence [facility]) Discharge Diagnoses: Patient Active Problem List   Diagnosis Date Noted  . Severe episode of recurrent major depressive disorder, without psychotic features (HCC) [F33.2]   . Depression, major, severe recurrence (HCC) [F33.2] 01/18/2016  . 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 Adjustment disorder with depressed mood [F43.21]   . Major depressive disorder, recurrent episode, severe (HCC) [F33.2] 11/01/2014  . MDD (major depressive disorder) (HCC) [F32.9] 03/26/2013  . ADHD (attention deficit hyperactivity disorder) [F90.9] 03/26/2013  . Anxiety [F41.9]   . Depression [F32.9]   . Acid reflux [K21.9]   . Irritable bowel syndrome [K58.9]     Past Psychiatric History: See Above  Past Medical  History:  Past Medical History  Diagnosis Date  . Anxiety   . Depression   . Acid reflux   . Irritable bowel syndrome   . PTSD (post-traumatic stress disorder)   . Bipolar 1 disorder (HCC)   . ADHD (attention deficit hyperactivity disorder)   . Insomnia     Past Surgical History  Procedure Laterality Date  . Upper gastrointestinal endoscopy     Family History:  Family History  Problem Relation Age of Onset  . Heart failure Father    Family Psychiatric  History: See Above Social History:  History  Alcohol Use No    Comment: Reports had an issue in the past     History  Drug Use No    Comment: Denies drug use but positive cocaine; denied after confronted with result    Social History   Social History  . Marital Status: Legally Separated    Spouse Name: N/A  . Number of Children: N/A  . Years of Education: N/A   Social History Main Topics  . Smoking status: Never Smoker   . Smokeless tobacco: Current User    Types: Snuff  . Alcohol Use: No     Comment: Reports had an issue in the past  . Drug Use: No     Comment: Denies drug use but positive cocaine; denied after confronted with result  . Sexual Activity: Not Asked   Other Topics Concern  . None   Social History Narrative    Hospital Course: [facility] was admitted for Depression, major, severe recurrence (HCC)  and crisis management.  Pt was treated discharged with the medications listed  below under Medication List.  Medical problems were identified and treated as needed.  Home medications were restarted as appropriate.  Improvement was monitored by observation and Tyler Lambert 's daily report of symptom reduction.  Emotional and mental status was monitored by daily self-inventory reports completed by Tyler Lambert and clinical staff.         Tyler Lambert was evaluated by the treatment team for stability and plans for continued recovery upon discharge. Tyler Northdam J Lambert 's motivation was an integral factor  for scheduling further treatment. Employment, transportation, bed availability, health status, family support, and any pending legal issues were also considered during hospital stay. Pt was offered further treatment options upon discharge including but not limited to Residential, Intensive Outpatient, and Outpatient treatment.  Tyler Lambert will follow up with the services as listed below under Follow Up Information.     Upon completion of this admission the patient was both mentally and medically stable for discharge denying suicidal/homicidal ideation, auditory/visual/tactile hallucinations, delusional thoughts and paranoia.     Tyler Lambert responded well to treatment with Cymbalta, Vistaril  and Thiamine 100 mg without adverse effects.. Pt demonstrated improvement without reported or observed adverse effects to the point of stability appropriate for outpatient management. Pertinent labs include: CMP for which outpatient follow-up is necessary for lab recheck as mentioned below. Reviewed CBC, CMP, BAL, and UDS+ Cocaine and Benzodiazepine; all unremarkable aside from noted exceptions.   Physical Findings: AIMS: Facial and Oral Movements Muscles of Facial Expression: None, normal Lips and Perioral Area: None, normal Jaw: None, normal Tongue: None, normal,Extremity Movements Upper (arms, wrists, hands, fingers): None, normal Lower (legs, knees, ankles, toes): None, normal, Trunk Movements Neck, shoulders, hips: None, normal, Overall Severity Severity of abnormal movements (highest score from questions above): None, normal Incapacitation due to abnormal movements: None, normal Patient's awareness of abnormal movements (rate only patient's report): No Awareness, Dental Status Current problems with teeth and/or dentures?: No Does patient usually wear dentures?: No  CIWA:  CIWA-Ar Total: 0 COWS:     Musculoskeletal: Strength & Muscle Tone: within normal limits Gait & Station: normal Patient  leans: N/A  Psychiatric Specialty Exam: See SRA BY MD Physical Exam  Nursing note and vitals reviewed. Constitutional: He is oriented to person, place, and time.  Musculoskeletal: Normal range of motion.  Neurological: He is alert and oriented to person, place, and time.  Psychiatric: He has a normal mood and affect. His behavior is normal.    Review of Systems  Psychiatric/Behavioral: Negative.  Negative for depression (stable), suicidal ideas and hallucinations. The patient is not nervous/anxious (stable).   All other systems reviewed and are negative.   Blood pressure 116/68, pulse 95, temperature 98 F (36.7 C), temperature source Oral, resp. rate 14, height 5' 8.25" (1.734 m), weight 76.204 kg (168 lb).Body mass index is 25.34 kg/(m^2).    Have you used any form of tobacco in the last 30 days? (Cigarettes, Smokeless Tobacco, Cigars, and/or Pipes): Yes  Has this patient used any form of tobacco in the last 30 days? (Cigarettes, Smokeless Tobacco, Cigars, and/or Pipes)Yes, A prescription for an FDA-approved tobacco cessation medication was offered at discharge and the patient refused  Blood Alcohol level:  Lab Results  Component Value Date   [facility]   ETH <5 10/31/2014    Metabolic Disorder Labs:  Lab Results  Component Value Date   HGBA1C 5.1 11/02/2014   MPG 100 11/02/2014   No results found  for: PROLACTIN Lab Results  Component Value Date   CHOL 182 11/02/2014   TRIG 353* 11/02/2014   HDL 35* 11/02/2014   CHOLHDL 5.2 11/02/2014   VLDL 71* 11/02/2014   LDLCALC 76 11/02/2014    See Psychiatric Specialty Exam and Suicide Risk Assessment completed by Attending Physician prior to discharge.  Discharge destination:  Home  Is patient on multiple antipsychotic therapies at discharge:  No   Has Patient had three or more failed trials of antipsychotic monotherapy by history:  No  Recommended Plan for Multiple Antipsychotic Therapies: NA  Discharge  Instructions    Activity as tolerated - No restrictions    Complete by:  As directed      Diet general    Complete by:  As directed      Discharge instructions    Complete by:  As directed   Take all medications as prescribed. Keep all follow-up appointments as scheduled.  Do not consume alcohol or use illegal drugs while on prescription medications. Report any adverse effects from your medications to your primary care provider promptly.  In the event of recurrent symptoms or worsening symptoms, call 911, a crisis hotline, or go to the nearest emergency department for evaluation.            Medication List    STOP taking these medications        diphenhydramine-acetaminophen 25-500 MG Tabs tablet  Commonly known as:  TYLENOL PM     FLUoxetine 20 MG capsule  Commonly known as:  PROZAC     gabapentin 100 MG capsule  Commonly known as:  NEURONTIN     OLANZapine 2.5 MG tablet  Commonly known as:  ZYPREXA      TAKE these medications      Indication   clonazePAM 0.5 MG tablet  Commonly known as:  KLONOPIN  Take 1 tablet (0.5 mg total) by mouth 2 (two) times daily.   Indication:  anxiety     DULoxetine HCl 40 MG Cpep  Take 40 mg by mouth daily.   Indication:  mood stablilzation     multivitamin with minerals Tabs tablet  Take 1 tablet by mouth daily.   Indication:  mtiv     zolpidem 5 MG tablet  Commonly known as:  AMBIEN  Take 1 tablet (5 mg total) by mouth at bedtime and may repeat dose one time if needed. For sleep   Indication:  Trouble Sleeping           Follow-up Information    Follow up with [facility].   Specialty:  Family Medicine   Why:  Medications management appointment requested w this provider on 6/8   Contact information:   [address] [place] [address] [phone]       Follow up with [facility] On 01/25/2016.   Why:  at 5:00pm for therapy with Judeth Cornfield.   Contact information:   [address]  [address] [place], [place] [address]                  Phone: [phone] Fax:  [phone]      Follow-up recommendations:  Activity:  as tolerated Diet:  heart healthy as prdx by Primary Care Provider  Comments: Take all medications as prescribed. Keep all follow-up appointments as scheduled.  Do not consume alcohol or use illegal drugs while on prescription medications. Report any adverse effects from your medications to your primary care  provider promptly.  In the event of recurrent symptoms or worsening symptoms, call 911, a crisis hotline, or go to the nearest emergency department for evaluation.   Signed: Oneta Rack, NP 01/22/2016, 10:25 AM  Patient seen, Suicide Assessment Completed.  Disposition Plan Reviewed

## 2016-01-22 NOTE — [facility] Suicide Risk Assessment (Signed)
[facility]   Principal Problem: Depression, major, severe recurrence [facility](HCC) Discharge Diagnoses:  Patient Active Problem List   Diagnosis Date Noted  . Severe episode of recurrent major depressive disorder, without psychotic features (HCC) [F33.2]   . Depression, major, severe recurrence (HCC) [F33.2] 01/18/2016  . 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 Adjustment disorder with depressed mood [F43.21]   . Major depressive disorder, recurrent episode, severe (HCC) [F33.2] 11/01/2014  . MDD (major depressive disorder) (HCC) [F32.9] 03/26/2013  . ADHD (attention deficit hyperactivity disorder) [F90.9] 03/26/2013  . Anxiety [F41.9]   . Depression [F32.9]   . Acid reflux [K21.9]   . Irritable bowel syndrome [K58.9]     Total Time spent with patient: 30 minutes  Musculoskeletal: Strength & Muscle Tone: within normal limits Gait & Station: normal Patient leans: N/A  Psychiatric Specialty Exam: ROS denies headache, denies chest pain, denies shortness of breath   Blood pressure 116/68, pulse 95, temperature 98 F (36.7 C), temperature source Oral, resp. rate 14, height 5' 8.25" (1.734 m), weight 168 lb (76.204 kg).Body mass index is 25.34 kg/(m^2).  General Appearance: Well Groomed  Patent attorneyye Contact::  Fair  Speech:  Normal Rate409  Volume:  Normal  Mood:  improving , feels better, less depressed   Affect:  more reactive, less irritable  Thought Process:  Linear  Orientation:  Full (Time, Place, and Person)  Thought Content:  no hallucinations, no delusions   Suicidal Thoughts:  No- denies any suicidal ideations, denies self injurious ideations   Homicidal Thoughts:  No denies any violent or homicidal ideations   Memory:  recent and remote grossly intact   Judgement:  Other:  improving   Insight:  improving   Psychomotor Activity:  Normal  Concentration:  Good  Recall:  Good  Fund of Knowledge:Good   Language: Negative  Akathisia:  Negative  Handed:  Right  AIMS (if indicated):     Assets:  Desire for Improvement Housing  Sleep:  Number of Hours: 5.5  Cognition: WNL  ADL's:  Intact   Mental Status Per Nursing Assessment::   On Admission:  Suicidal ideation indicated by patient, Suicide plan, Self-harm thoughts  Demographic Factors:  40 year old separated male, lives with son, on disability   Loss Factors:   Historical Factors: Reports history of depression, history of Anxiety, history of Social Phobia, prior psychiatric admissions   Risk Reduction Factors:   Responsible for children under 41 years of age, Sense of responsibility to family and Positive coping skills or problem solving skills  Continued Clinical Symptoms:  At this time patient is alert, attentive, better groomed, calm, mood improved, affect appropriate, fuller in range, no thought disorder, no SI, no HI, no psychotic symptoms. Denies medication side effects.  Of note, patient reports he has been on BZDs for many years, for severe anxiety , denies side effects  Cognitive Features That Contribute To Risk:  No gross cognitive deficits noted upon discharge. Is alert , attentive, and oriented x 3   Suicide Risk:  Mild:  Suicidal ideation of limited frequency, intensity, duration, and specificity.  There are no identifiable plans, no associated intent, mild dysphoria and related symptoms, good self-control (both objective and subjective assessment), few other risk factors, and identifiable protective factors, including available and accessible social support.  Follow-up Information    Follow up with [facility].   Specialty:  Family Medicine   Why:  Medications management appointment  requested w this provider on 6/8   Contact information:   [address] [place] [address] [phone]       Follow up with [facility].   Why:  Patient sees therapist  Judeth Cornfield at this provider, time/date of next appointment requested   Contact information:   [address]  [address]           [place], [place] [address]                                                     Phone: [phone] Fax:  [phone]                                                             Plan Of Care/Follow-up recommendations:  Activity:  as tolerated  Diet:  Regular  Tests:  NA Other:  As below  Patient is requesting discharge, and at this time there are no grounds for involuntary commitment  Patient plans to return home Plans to follow up as above  Nehemiah Massed, MD 01/22/2016, 10:08 AM

## 2016-02-25 ENCOUNTER — Emergency Department (HOSPITAL_COMMUNITY)
Admission: EM | Admit: 2016-02-25 | Discharge: 2016-02-26 | Disposition: A | Discharge status: Other Acute Inpatient Hospital | Payer: Medicare Other | Attending: Emergency Medicine | Admitting: Emergency Medicine

## 2016-02-25 DIAGNOSIS — F332 Major depressive disorder, recurrent severe without psychotic features: Secondary | ICD-10-CM | POA: Insufficient documentation

## 2016-02-25 DIAGNOSIS — F329 Major depressive disorder, single episode, unspecified: Secondary | ICD-10-CM | POA: Diagnosis present

## 2016-02-25 DIAGNOSIS — F1190 Opioid use, unspecified, uncomplicated: Secondary | ICD-10-CM | POA: Insufficient documentation

## 2016-02-25 DIAGNOSIS — F909 Attention-deficit hyperactivity disorder, unspecified type: Secondary | ICD-10-CM | POA: Insufficient documentation

## 2016-02-25 DIAGNOSIS — F1490 Cocaine use, unspecified, uncomplicated: Secondary | ICD-10-CM | POA: Insufficient documentation

## 2016-02-25 DIAGNOSIS — F4310 Post-traumatic stress disorder, unspecified: Secondary | ICD-10-CM | POA: Diagnosis not present

## 2016-02-25 DIAGNOSIS — R45851 Suicidal ideations: Secondary | ICD-10-CM | POA: Diagnosis present

## 2016-02-25 DIAGNOSIS — Z79899 Other long term (current) drug therapy: Secondary | ICD-10-CM | POA: Diagnosis not present

## 2016-02-25 DIAGNOSIS — F17210 Nicotine dependence, cigarettes, uncomplicated: Secondary | ICD-10-CM | POA: Diagnosis not present

## 2016-02-25 LAB — ETHANOL: Alcohol, Ethyl (B): 129 mg/dL — ABNORMAL HIGH (ref <5)

## 2016-02-25 LAB — ACETAMINOPHEN LEVEL: Acetaminophen (Tylenol), Serum: <10 ug/mL — ABNORMAL LOW (ref 10–30)

## 2016-02-25 LAB — COMPREHENSIVE METABOLIC PANEL
ALT: 16 U/L — ABNORMAL LOW (ref 17–63)
AST: 16 U/L (ref 15–41)
Albumin: 4.6 g/dL (ref 3.5–5.0)
Alkaline Phosphatase: 46 U/L (ref 38–126)
Anion gap: 9 (ref 5–15)
BUN: 10 mg/dL (ref 6–20)
CO2: 23 mmol/L (ref 22–32)
Calcium: 9.4 mg/dL (ref 8.9–10.3)
Chloride: 110 mmol/L (ref 101–111)
Creatinine, Ser: 0.89 mg/dL (ref 0.61–1.24)
GFR calc Af Amer: >60 mL/min (ref >60)
GFR calc non Af Amer: >60 mL/min (ref >60)
Glucose, Bld: 87 mg/dL (ref 65–99)
Potassium: 3.3 mmol/L — ABNORMAL LOW (ref 3.5–5.1)
Sodium: 142 mmol/L (ref 135–145)
Total Bilirubin: 1.2 mg/dL (ref 0.3–1.2)
Total Protein: 7.8 g/dL (ref 6.5–8.1)

## 2016-02-25 LAB — RAPID URINE DRUG SCREEN, HOSP PERFORMED
Amphetamines: NOT DETECTED
Barbiturates: NOT DETECTED
Benzodiazepines: NOT DETECTED
Cocaine: NOT DETECTED
Opiates: NOT DETECTED
Tetrahydrocannabinol: NOT DETECTED

## 2016-02-25 LAB — CBC
HCT: 41.2 % (ref 39.0–52.0)
Hemoglobin: 14.7 g/dL (ref 13.0–17.0)
MCH: 30.2 pg (ref 26.0–34.0)
MCHC: 35.7 g/dL (ref 30.0–36.0)
MCV: 84.6 fL (ref 78.0–100.0)
Platelets: 234 10*3/uL (ref 150–400)
RBC: 4.87 MIL/uL (ref 4.22–5.81)
RDW: 13.4 % (ref 11.5–15.5)
WBC: 10.3 10*3/uL (ref 4.0–10.5)

## 2016-02-25 LAB — SALICYLATE LEVEL: Salicylate Lvl: <4 mg/dL (ref 2.8–30.0)

## 2016-02-25 NOTE — ED Notes (Signed)
Pt reports SI, no plan. Triggered by "stupid stuff." Has occasionally had thoughts of hurting others. Last drink etoh today.

## 2016-02-25 NOTE — ED Provider Notes (Signed)
CSN: [id number]     Arrival date & time 02/25/16  1417 History  By signing my name below, I, Placido Sou, attest that this documentation has been prepared under the direction and in the presence of Raeford Razor, MD. Electronically Signed: Placido Sou, ED Scribe. 02/25/2016. 3:23 PM.   Chief Complaint  Patient presents with  . Suicidal   The history is provided by the patient. No language interpreter was used.   HPI Comments: Tyler Lambert is a 40 y.o. male with a PMHx of PTSD, bipolar 1 disorder and ADHD who presents to the Emergency Department complaining of SI without a plan. He reports a hx of cutting himself and states "I have spur of the moment thoughts where I have something in my hand and I think something could happen" but denies any recent self injury. Pt states I "keep getting this stupid stuff in my head and I'm tired of it going on". He states "I am doing drugs and I'm not a good father" as well as "I'm ashamed of the things I've done". Pt has custody of his child who is currently being watched by his grandmother. He has sole custody and states his child's mother is "worse than I am". 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 He states that "everything has changed and I used to be productive". Pt denies having been on psychiatric medications for ~4-5 months due to "missing a lot of appointments" stating "I've had good reasons and bad reasons" noting that "I know I need to get back on my meds". Pt states that he feels "positive" and "good" after each stay behavioral health but then struggles after leaving noting "I feel like it's a cycle". He typically uses cocaine and heroin with his last usage being 3 days ago. 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 His last ETOH consumption was today so he could "relax and come in here".    Past Medical History  Diagnosis Date  . Anxiety   .  Depression   . Acid reflux   . Irritable bowel syndrome   . PTSD (post-traumatic stress disorder)   . Bipolar 1 disorder (HCC)   . ADHD (attention deficit hyperactivity disorder)   . Insomnia    Past Surgical History  Procedure Laterality Date  . Upper gastrointestinal endoscopy     Family History  Problem Relation Age of Onset  . Heart failure Father    Social History  Substance Use Topics  . Smoking status: Never Smoker   . Smokeless tobacco: Current User    Types: Snuff  . Alcohol Use: No     Comment: Reports had an issue in the past    Review of Systems  Psychiatric/Behavioral: Positive for suicidal ideas. Negative for behavioral problems, self-injury and agitation.  All other systems reviewed and are negative.  Allergies  Review of patient's allergies indicates no known allergies.  Home Medications   Prior to Admission medications   Medication Sig Start Date End Date Taking? Authorizing Provider  diphenhydramine-acetaminophen (TYLENOL PM) 25-500 MG TABS tablet Take 1 tablet by mouth at bedtime as needed (sleep/pain).   Yes Historical Provider, MD  DULoxetine 40 MG CPEP Take 40 mg by mouth daily. Patient not taking: Reported on 02/25/2016 01/22/16   Oneta Rack, NP  Multiple Vitamin (MULTIVITAMIN WITH MINERALS) TABS tablet Take 1 tablet by mouth daily. Patient not taking: Reported  on 02/25/2016 01/22/16   Oneta Rackanika N Lewis, NP  zolpidem (AMBIEN) 5 MG tablet Take 1 tablet (5 mg total) by mouth at bedtime and may repeat dose one time if needed. For sleep Patient not taking: Reported on 01/18/2016 11/06/14   Shuvon B Rankin, NP   BP 124/83 mmHg  Pulse 109  Temp(Src) 99.2 F (37.3 C) (Oral)  Resp 16  SpO2 98%    Physical Exam  Constitutional: He is oriented to person, place, and time. He appears well-developed and well-nourished.  HENT:  Head: Normocephalic.  Eyes: EOM are normal.  Neck: Normal range of motion.  Pulmonary/Chest: Effort normal.  Abdominal: He exhibits  no distension.  Musculoskeletal: Normal range of motion.  Neurological: He is alert and oriented to person, place, and time.  Psychiatric: He has a normal mood and affect.  Flat affect with poor eye contact.   Nursing note and vitals reviewed.   ED Course  Procedures  DIAGNOSTIC STUDIES: Oxygen Saturation is 98% on RA, normal by my interpretation.    COORDINATION OF CARE: 3:20 PM Discussed next steps with pt. Pt verbalized understanding and is agreeable with the plan.   Labs Review Labs Reviewed  COMPREHENSIVE METABOLIC PANEL - Abnormal; Notable for the following:    Potassium 3.3 (*)    ALT 16 (*)    All other components within normal limits  ETHANOL - Abnormal; Notable for the following:    Alcohol, Ethyl (B) 129 (*)    All other components within normal limits  ACETAMINOPHEN LEVEL - Abnormal; Notable for the following:    Acetaminophen (Tylenol), Serum <10 (*)    All other components within normal limits  SALICYLATE LEVEL  CBC  URINE RAPID DRUG SCREEN, HOSP PERFORMED    Imaging Review No results found. I have personally reviewed and evaluated these images and lab results as part of my medical decision-making.   EKG Interpretation None      MDM   Final diagnoses:  Severe episode of recurrent major depressive disorder, without psychotic features (HCC)    39yM with significant depression and suicidal thoughts. Medically cleared. TTS evaluation.   I personally preformed the services scribed in my presence. The recorded information has been reviewed is accurate. Raeford RazorStephen Alondra Vandeven, MD.    Raeford RazorStephen Halley Kincer, MD 02/27/16 [phone]

## 2016-02-25 NOTE — ED Notes (Signed)
Pt AAO x 3, no distress noted, calm & cooperative at present.  Remains SI.  Monitoring for safety, Q 15 min checks in effect.

## 2016-02-25 NOTE — ED Notes (Signed)
Pt ambulatory w/o difficulty from triage 

## 2016-02-26 ENCOUNTER — Observation Stay (HOSPITAL_COMMUNITY)
Admission: AD | Admit: 2016-02-26 | Discharge: 2016-02-27 | Disposition: A | Discharge status: Home / Self Care | Payer: Medicare Other | Source: Intra-hospital | Attending: Psychiatry | Admitting: Psychiatry

## 2016-02-26 ENCOUNTER — Encounter (HOSPITAL_COMMUNITY): Payer: Self-pay | Admitting: *Deleted

## 2016-02-26 DIAGNOSIS — F17210 Nicotine dependence, cigarettes, uncomplicated: Secondary | ICD-10-CM | POA: Diagnosis not present

## 2016-02-26 DIAGNOSIS — F314 Bipolar disorder, current episode depressed, severe, without psychotic features: Principal | ICD-10-CM | POA: Insufficient documentation

## 2016-02-26 DIAGNOSIS — F332 Major depressive disorder, recurrent severe without psychotic features: Secondary | ICD-10-CM | POA: Diagnosis not present

## 2016-02-26 DIAGNOSIS — F4310 Post-traumatic stress disorder, unspecified: Secondary | ICD-10-CM | POA: Insufficient documentation

## 2016-02-26 DIAGNOSIS — F909 Attention-deficit hyperactivity disorder, unspecified type: Secondary | ICD-10-CM | POA: Diagnosis not present

## 2016-02-26 DIAGNOSIS — F4321 Adjustment disorder with depressed mood: Secondary | ICD-10-CM | POA: Diagnosis not present

## 2016-02-26 DIAGNOSIS — F419 Anxiety disorder, unspecified: Secondary | ICD-10-CM | POA: Diagnosis not present

## 2016-02-26 DIAGNOSIS — K589 Irritable bowel syndrome without diarrhea: Secondary | ICD-10-CM | POA: Diagnosis not present

## 2016-02-26 DIAGNOSIS — K219 Gastro-esophageal reflux disease without esophagitis: Secondary | ICD-10-CM | POA: Insufficient documentation

## 2016-02-26 DIAGNOSIS — R45851 Suicidal ideations: Secondary | ICD-10-CM | POA: Diagnosis not present

## 2016-02-26 DIAGNOSIS — Y906 Blood alcohol level of 120-199 mg/100 ml: Secondary | ICD-10-CM | POA: Insufficient documentation

## 2016-02-26 DIAGNOSIS — F331 Major depressive disorder, recurrent, moderate: Secondary | ICD-10-CM | POA: Diagnosis present

## 2016-02-26 MED ORDER — MAGNESIUM HYDROXIDE 400 MG/5ML PO SUSP: 30.0000 mL | Freq: Every day | ORAL | Status: DC | PRN

## 2016-02-26 MED ORDER — ACETAMINOPHEN 325 MG PO TABS
650.0000 mg | ORAL_TABLET | Freq: Four times a day (QID) | ORAL | Status: DC | PRN
  Administered 2016-02-27: 650 mg via ORAL
  Filled 2016-02-26: qty 2

## 2016-02-26 MED ORDER — TRAZODONE HCL 50 MG PO TABS: 50.0000 mg | ORAL_TABLET | Freq: Every day | ORAL | Status: DC

## 2016-02-26 MED ORDER — DULOXETINE HCL 20 MG PO CPEP
40.0000 mg | ORAL_CAPSULE | Freq: Every day | ORAL | Status: DC
  Administered 2016-02-27: 40 mg via ORAL
  Filled 2016-02-26 (×4): qty 2

## 2016-02-26 MED ORDER — ALUM & MAG HYDROXIDE-SIMETH 200-200-20 MG/5ML PO SUSP: 30.0000 mL | ORAL | Status: DC | PRN

## 2016-02-26 MED ORDER — HYDROXYZINE HCL 25 MG PO TABS
25.0000 mg | ORAL_TABLET | Freq: Three times a day (TID) | ORAL | Status: DC | PRN
  Administered 2016-02-26: 25 mg via ORAL
  Filled 2016-02-26: qty 1

## 2016-02-26 MED ORDER — NICOTINE 21 MG/24HR TD PT24
21.0000 mg | MEDICATED_PATCH | Freq: Once | TRANSDERMAL | Status: DC
  Administered 2016-02-26: 21 mg via TRANSDERMAL
  Filled 2016-02-26: qty 1

## 2016-02-26 MED ORDER — DULOXETINE HCL 20 MG PO CPEP
40.0000 mg | ORAL_CAPSULE | Freq: Every day | ORAL | Status: DC
  Administered 2016-02-26: 40 mg via ORAL
  Filled 2016-02-26: qty 2

## 2016-02-26 MED ORDER — TRAZODONE HCL 50 MG PO TABS
50.0000 mg | ORAL_TABLET | Freq: Every day | ORAL | Status: DC
  Administered 2016-02-26: 50 mg via ORAL
  Filled 2016-02-26: qty 1

## 2016-02-26 MED ORDER — HYDROXYZINE HCL 25 MG PO TABS
25.0000 mg | ORAL_TABLET | Freq: Three times a day (TID) | ORAL | Status: DC | PRN
  Administered 2016-02-26 – 2016-02-27 (×2): 25 mg via ORAL
  Filled 2016-02-26 (×2): qty 1
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
## 2016-02-26 NOTE — Progress Notes (Signed)
Patient ID: Tyler Lambert, male   DOB: [birth year], 40 y.o.   MRN: [id number] Per State regulations 482.30 this chart was reviewed for medical necessity with respect to the patient's admission/duration of stay.    Next review date: 02/28/16  Thurman CoyerEric Zaccai Chavarin, BSN, RN-BC  Case Manager

## 2016-02-26 NOTE — ED Notes (Signed)
Pt discharged ambulatory with [place] driver.  All belongings were sent with patient. 

## 2016-02-26 NOTE — Tx Team (Signed)
Initial Interdisciplinary Treatment Plan   PATIENT STRESSORS: Financial difficulties Medication change or noncompliance Substance abuse   PATIENT STRENGTHS: Ability for insight Average or above average intelligence Capable of independent living Communication skills General fund of knowledge Motivation for treatment/growth Physical Health Supportive family/friends   PROBLEM LIST: Problem List/Patient Goals Date to be addressed Date deferred Reason deferred Estimated date of resolution  Substance abuse      Self harm thoughts                                                 DISCHARGE CRITERIA:  Adequate post-discharge living arrangements Improved stabilization in mood, thinking, and/or behavior Need for constant or close observation no longer present Reduction of life-threatening or endangering symptoms to within safe limits Verbal commitment to aftercare and medication compliance  PRELIMINARY DISCHARGE PLAN: Attend 12-step recovery group Outpatient therapy Placement in alternative living arrangements  PATIENT/FAMIILY INVOLVEMENT: This treatment plan has been presented to and reviewed with the patient, Tyler Lambert.  The patient and family have been given the opportunity to ask questions and make suggestions.  Areatha Keasraxler, Tachina Spoonemore Marie 02/26/2016, 6:25 PM

## 2016-02-26 NOTE — BH Assessment (Signed)
[facility] Assessment Progress Note  Per Thedore MinsMojeed Akintayo, MD, this pt [facility].  Berneice Heinrichina Tate, RN, [facility] has assigned pt to Obs 1.  Pt has signed Voluntary Admission and Consent for Treatment, as well as Consent to Release Information to [facility], and a notification call has been placed.  Signed [facility].  Pt's nurse, Kendal Hymendie, has been notified, and agrees to send original paperwork along with pt via [facility], and to call report to [phone] or [phone].  Doylene Canninghomas Luken Shadowens, MA Triage Specialist [phone]
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
## 2016-02-26 NOTE — ED Notes (Signed)
Pt is very anxious,  He was given prn Vistaril and asked for something stronger.  He was calm and cooperative when I explained that he couldn't get anything stronger.  He continues to c/o suicidal thoughts but contracts for safety.  15 minute checks and video monitoring continue.

## 2016-02-27 DIAGNOSIS — F331 Major depressive disorder, recurrent, moderate: Secondary | ICD-10-CM

## 2016-02-27 DIAGNOSIS — F314 Bipolar disorder, current episode depressed, severe, without psychotic features: Secondary | ICD-10-CM | POA: Diagnosis not present

## 2016-02-27 MED ORDER — ADULT MULTIVITAMIN W/MINERALS CH: 1.0000 | ORAL_TABLET | Freq: Every day | ORAL | Status: AC

## 2016-02-27 MED ORDER — DULOXETINE HCL 40 MG PO CPEP: 40.0000 mg | ORAL_CAPSULE | Freq: Every day | ORAL | Status: AC

## 2016-02-27 MED ORDER — TRAZODONE HCL 50 MG PO TABS: 50.0000 mg | ORAL_TABLET | Freq: Every day | ORAL | Status: AC
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
## 2016-02-27 NOTE — Progress Notes (Addendum)
Patient appear sad and depressed. Patient reported having a diagnosis of Irritable Bowel Syndrome (IBS) and takes Prilosec or Nexium and will like to continue the medications. Staff left a sticky note for doctor's review in the morning. Patient denies pain, SI, AH/VH at this time. Staff offered support and encouragement as needed. Safety maintained at all times. No behavioral issues noted. Patient sleeping at this time.

## 2016-02-27 NOTE — Progress Notes (Signed)
Patient was discharged at 1240. Patient was given belongings and AVS.

## 2016-02-27 NOTE — Discharge Summary (Signed)
[facility]-Observation Unit Discharge Summary Note  Patient:  Tyler Lambert is an 40 y.o., male MRN:  [id number] DOB:  1976/01/19 Patient phone:  [phone] (home)  Patient address:   [address] [place] [address],  Total Time spent with patient: 30 minutes  Date of Admission:  02/26/2016 Date of Discharge: 02/27/2016  Reason for Admission: Depressive symptoms  Principal Problem: Major depressive disorder, recurrent episode, moderate degree (HCC) Discharge Diagnoses: Patient Active Problem List   Diagnosis Date Noted  . Major depressive disorder, recurrent episode, moderate degree (HCC) [F33.1] 02/26/2016  . Severe episode of recurrent major depressive disorder, without psychotic features (HCC) [F33.2]   . Depression, major, severe recurrence (HCC) [F33.2] 01/18/2016  . 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 Adjustment disorder with depressed mood [F43.21]   . Major depressive disorder, recurrent episode, severe (HCC) [F33.2] 11/01/2014  . MDD (major depressive disorder) (HCC) [F32.9] 03/26/2013  . ADHD (attention deficit hyperactivity disorder) [F90.9] 03/26/2013  . Anxiety [F41.9]   . Depression [F32.9]   . Acid reflux [K21.9]   . Irritable bowel syndrome [K58.9]     Past Psychiatric History: See Above  Past Medical History:  Past Medical History  Diagnosis Date  . Anxiety   . Depression   . Acid reflux   . Irritable bowel syndrome   . PTSD (post-traumatic stress disorder)   . Bipolar 1 disorder (HCC)   . ADHD (attention deficit hyperactivity disorder)   . Insomnia     Past Surgical History  Procedure Laterality Date  . Upper gastrointestinal endoscopy     Family History:  Family History  Problem Relation Age of Onset  . Heart failure Father    Family Psychiatric  History: See Above Social History:  History  Alcohol Use  . 14.4 oz/week  . 24 Cans of beer per week    Comment: Reports had an issue in  the past     History  Drug Use  . Yes  . Special: Cocaine, Heroin    Comment: Denies drug use but positive cocaine; denied after confronted with result    Social History   Social History  . Marital Status: Legally Separated    Spouse Name: N/A  . Number of Children: N/A  . Years of Education: N/A   Social History Main Topics  . Smoking status: Light Tobacco Smoker -- 0.25 packs/day    Types: Cigarettes  . Smokeless tobacco: Current User    Types: Snuff  . Alcohol Use: 14.4 oz/week    24 Cans of beer per week     Comment: Reports had an issue in the past  . Drug Use: Yes    Special: Cocaine, Heroin     Comment: Denies drug use but positive cocaine; denied after confronted with result  . Sexual Activity: Not Asked   Other Topics Concern  . None   Social History Narrative    Hospital Course:        Tyler Lambert is an 40 y.o. male that presents this date with some passive suicidal ideation but no plan. Patient reports periodic use of cocaine and Heroin (last use of cocaine last month reported use of 1 gram along with .10 gram of Heroin). His current urine drug screen is negative and alcohol level was 129 on admission. Patient reports current ETOH use stating he consumed 6 12oz beers prior to admission and daily use of alcohol for the last year. Patient reports  he is being involved in treatment at [facility] where he receives therapy and medication management (although pt states he has not been on medications for the last month.) [facility] but states he has not been on medications since then. He reports that after leaving he did not continue on the Cymbalta but rather began to drink alcohol to deal with his anxiety. Patient was very focused during the assessment on being prescribed xanax or klonopin to help with his symptoms. [name] quickly dismissed other options that are non- addictive such as buspar, neurontin, or vistaril to treat  his symptoms. Upon being informed that a benzo would not be prescribed because of his substance abuse history the patient requested to be discharged from the Observation Unit. Tyler Lambert reports that he still has the prescriptions from his discharge last month that were never filled. Patient agrees to follow up with his outpatient provider by contacting their office on Monday. His vitals were noted to be stable and there were no active signs of withdrawal from prior alcohol abuse. He identifies psychosocial stressors which are contributing to his depression- separation, disability,financial difficulties, legal issues .He endorses neuro-vegetative symptoms of depression as below . Denies psychotic symptoms . Of note, he is not taking any psychiatric medications at this time, states he had been on Xanax for anxiety but had stopped it in May of 2017. Patient was found stable for discharge on 02/27/2016.   Physical Findings: AIMS: Facial and Oral Movements Muscles of Facial Expression: None, normal Lips and Perioral Area: None, normal Jaw: None, normal Tongue: None, normal,Extremity Movements Upper (arms, wrists, hands, fingers): None, normal Lower (legs, knees, ankles, toes): None, normal, Trunk Movements Neck, shoulders, hips: None, normal, Overall Severity Severity of abnormal movements (highest score from questions above): None, normal Incapacitation due to abnormal movements: None, normal Patient's awareness of abnormal movements (rate only patient's report): No Awareness, Dental Status Current problems with teeth and/or dentures?: No Does patient usually wear dentures?: No  CIWA:  CIWA-Ar Total: 5 COWS:  COWS Total Score: 3  Musculoskeletal: Strength & Muscle Tone: within normal limits Gait & Station: normal Patient leans: N/A  Physical Exam  Nursing note and vitals reviewed. Constitutional: He is oriented to person, place, and time.  Musculoskeletal: Normal range of motion.  Neurological: He  is alert and oriented to person, place, and time.  Psychiatric: He has a normal mood and affect. His behavior is normal.    Review of Systems  Psychiatric/Behavioral: Negative.  Negative for depression (stable), suicidal ideas and hallucinations. The patient is not nervous/anxious (stable).   All other systems reviewed and are negative.   Blood pressure 120/72, pulse 89, temperature 98.2 F (36.8 C), temperature source Oral, resp. rate 16, height 5\' 8"  (1.727 m), weight 72.576 kg (160 lb), SpO2 99 %.Body mass index is 24.33 kg/(m^2).    Have you used any form of tobacco in the last 30 days? (Cigarettes, Smokeless Tobacco, Cigars, and/or Pipes): Yes  Has this patient used any form of tobacco in the last 30 days? (Cigarettes, Smokeless Tobacco, Cigars, and/or Pipes)Yes, A prescription for an FDA-approved tobacco cessation medication was offered at discharge and the patient refused  Blood Alcohol level:  Lab Results  Component Value Date   [facility] 02/25/2016   ETH <5 01/18/2016    Metabolic Disorder Labs:  Lab Results  Component Value Date   HGBA1C 5.1 11/02/2014   MPG 100 11/02/2014   No results found for:  PROLACTIN Lab Results  Component Value Date   CHOL 182 11/02/2014   TRIG 353* 11/02/2014   HDL 35* 11/02/2014   CHOLHDL 5.2 11/02/2014   VLDL 71* 11/02/2014   LDLCALC 76 11/02/2014    Discharge destination:  Home  Is patient on multiple antipsychotic therapies at discharge:  No   Has Patient had three or more failed trials of antipsychotic monotherapy by history:  No  Recommended Plan for Multiple Antipsychotic Therapies: NA     Medication List    TAKE these medications      Indication   diphenhydramine-acetaminophen 25-500 MG Tabs tablet  Commonly known as:  TYLENOL PM  Take 1 tablet by mouth at bedtime as needed (sleep/pain).      DULoxetine HCl 40 MG Cpep  Take 40 mg by mouth daily.   Indication:  Major Depressive Disorder, mood stablilzation      multivitamin with minerals Tabs tablet  Take 1 tablet by mouth daily.   Indication:  mtiv     traZODone 50 MG tablet  Commonly known as:  DESYREL  Take 1 tablet (50 mg total) by mouth at bedtime.   Indication:  Trouble Sleeping       Follow-up Information    Follow up with [facility]. Go in 2 days.   Why:  follow up / continuation of care.   Contact information:   [address] [place] [address] [phone]       Follow-up recommendations:  Activity:  as tolerated Diet:  heart healthy as prdx by Primary Care Provider  Comments: Take all medications as prescribed. Keep all follow-up appointments as scheduled.  Do not consume alcohol or use illegal drugs while on prescription medications. Report any adverse effects from your medications to your primary care provider promptly.  In the event of recurrent symptoms or worsening symptoms, call 911, a crisis hotline, or go to the nearest emergency department for evaluation.   SignedFransisca Kaufmann, NP 02/27/2016, 3:28 PM  Agree with NP note as above

## 2016-02-27 NOTE — Discharge Instructions (Signed)
You are encouraged to follow up with [facility] which you have expressed and chosen as continuation of care option.

## 2016-02-27 NOTE — Progress Notes (Signed)
Patient stated that he was depressed, but denies SI/HI/AVH. Patient stated that he had pain in his right knee and back. Tylenol 650 mg administered.   Patient safety is maintained through constant observation, education, support, and encouragement offered.  Patient is receptive and compliant, will continue to monitor.
